# Patient Record
Sex: Female | Born: 2005 | Race: Black or African American | Hispanic: No | Marital: Single | Smoking: Never smoker
Health system: Southern US, Community
[De-identification: ages and names within clinical notes are randomized; demographics above are authoritative.]

## PROBLEM LIST (undated history)

## (undated) ENCOUNTER — Inpatient Hospital Stay (HOSPITAL_COMMUNITY): Payer: Self-pay

## (undated) DIAGNOSIS — F32A Depression, unspecified: Secondary | ICD-10-CM

## (undated) DIAGNOSIS — J45909 Unspecified asthma, uncomplicated: Secondary | ICD-10-CM

## (undated) DIAGNOSIS — H539 Unspecified visual disturbance: Secondary | ICD-10-CM

## (undated) DIAGNOSIS — F419 Anxiety disorder, unspecified: Secondary | ICD-10-CM

## (undated) HISTORY — DX: Depression, unspecified: F32.A

## (undated) HISTORY — PX: NO PAST SURGERIES: SHX2092

---
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
## 2023-03-06 ENCOUNTER — Encounter: Payer: Self-pay | Admitting: Allergy & Immunology

## 2023-03-06 ENCOUNTER — Other Ambulatory Visit: Payer: Self-pay

## 2023-03-06 ENCOUNTER — Ambulatory Visit (INDEPENDENT_AMBULATORY_CARE_PROVIDER_SITE_OTHER): Payer: Medicaid Other | Admitting: Allergy & Immunology

## 2023-03-06 VITALS — BP 96/62 | HR 100 | Temp 98.8°F | Resp 16 | Ht 64.06 in | Wt 134.2 lb

## 2023-03-06 DIAGNOSIS — J4520 Mild intermittent asthma, uncomplicated: Secondary | ICD-10-CM

## 2023-03-06 DIAGNOSIS — L2089 Other atopic dermatitis: Secondary | ICD-10-CM | POA: Diagnosis not present

## 2023-03-06 DIAGNOSIS — L500 Allergic urticaria: Secondary | ICD-10-CM | POA: Diagnosis not present

## 2023-03-06 DIAGNOSIS — J302 Other seasonal allergic rhinitis: Secondary | ICD-10-CM

## 2023-03-06 MED ORDER — CETIRIZINE HCL 10 MG PO TABS: 10.0000 mg | ORAL_TABLET | Freq: Every day | ORAL | 1 refills | Status: DC

## 2023-03-06 MED ORDER — ALBUTEROL SULFATE HFA 108 (90 BASE) MCG/ACT IN AERS: 2.0000 | INHALATION_SPRAY | Freq: Four times a day (QID) | RESPIRATORY_TRACT | 2 refills | Status: AC | PRN

## 2023-03-06 NOTE — Patient Instructions (Addendum)
1. Flexural atopic dermatitis - Skin looks great today. - Call us if you need a prescription for your skin.   2. Mild intermittent asthma, uncomplicated - Lung testing did not look awesome, but it did get better with the treatment. - But you did not FEEL better, so maybe this is more related to technique.  - I do not think you need a controller medication. - Continue with albuterol 2 puffs every 4-6 hours as needed.  - We will see how it looks next time before starting a daily medication, if needed.   3. Allergic urticaria - We are going to get an alpha gal panel to check for a red meat sensitivity. - We will call you in 1-2 weeks with the results of the testing.  - EpiPen use reviewed. - Emergency Action Plan provided.   4. Seasonal allergic rhinitis - We can do environmental testing in the future if needed. - Continue with the cetirizine 10mg  daily.   5. Return in about 6 months (around 09/06/2023). You can have the follow up appointment with Dr. Dellis Anes or a Nurse Practicioner (our Nurse Practitioners are excellent and always have Physician oversight!).    Please inform us of any Emergency Department visits, hospitalizations, or changes in symptoms. Call us before going to the ED for breathing or allergy symptoms since we might be able to fit you in for a sick visit. Feel free to contact us anytime with any questions, problems, or concerns.  It was a pleasure to meet you today!  Websites that have reliable patient information: 1. American Academy of Asthma, Allergy, and Immunology: www.aaaai.org 2. Food Allergy Research and Education (FARE): foodallergy.org 3. Mothers of Asthmatics: http://www.asthmacommunitynetwork.org 4. American College of Allergy, Asthma, and Immunology: www.acaai.org   COVID-19 Vaccine Information can be found at: [email or web address].[email or web address] For questions related to vaccine distribution or appointments,  please email [email or web address] or call [phone].   We realize that you might be concerned about having an allergic reaction to the COVID19 vaccines. To help with that concern, WE ARE OFFERING THE COVID19 VACCINES IN OUR OFFICE! Ask the front desk for dates!     "Like" [place] on Facebook and Instagram for our latest updates!      A healthy democracy works best when [employer] participate! Make sure you are registered to vote! If you have moved or changed any of your contact information, you will need to get this updated before voting!  In some cases, you MAY be able to register to vote online: [email or web address]

## 2023-03-06 NOTE — Progress Notes (Signed)
NEW PATIENT  Date of Service/Encounter:  03/06/23  Consult requested by: Practice, [facility]   Assessment:   Mild intermittent asthma, uncomplicated  Flexural atopic dermatitis  Allergic urticaria - getting alpha gal panel and tryptase  Seasonal allergic rhinitis - did not do testing since symptoms were not severe  Plan/Recommendations:   1. Flexural atopic dermatitis - Skin looks great today. - Call us if you need a prescription for your skin.   2. Mild intermittent asthma, uncomplicated - Lung testing did not look awesome, but it did get better with the treatment. - But you did not FEEL better, so maybe this is more related to technique.  - I do not think you need a controller medication. - Continue with albuterol 2 puffs every 4-6 hours as needed.  - We will see how it looks next time before starting a daily medication, if needed.   3. Allergic urticaria - We are going to get an alpha gal panel to check for a red meat sensitivity. - We will call you in 1-2 weeks with the results of the testing.  - EpiPen use reviewed. - Emergency Action Plan provided.   4. Seasonal allergic rhinitis - We can do environmental testing in the future if needed. - Continue with the cetirizine 10mg  daily.   5. Return in about 6 months (around 09/06/2023). You can have the follow up appointment with Dr. Dellis Ibarra or a Nurse Practicioner (our Nurse Practitioners are excellent and always have Physician oversight!).    This note in its entirety was forwarded to the Provider who requested this consultation.  Subjective:   Kelly Ibarra is a 17 y.o. female presenting today for evaluation of  Chief Complaint  Patient presents with   Allergic Reaction    Tick bite occurred May/June 2024 on leg and ankle. After bit she went on lunch and ate a chicken strip tray and began breaking out in hives/rash on the face and arms. After she began having trouble breathing. Initially she thought it  was reaction to the food. PCP says it could be from tick bite and sent her to our office.    Asthma    Says she had a flare up when she was bit.    Eczema    Says she has flare ups at times nit as bad as she was when she was a child. Says she notices huge flares with she changes soaps.     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  Kelly Ibarra was referred by Practice, [facility].     Kelly Ibarra is a 17 y.o. female presenting for an evaluation of an allergic reaction .  She got a tick bite two months ago. She started having some swelling around the site of the bites. She thought that this was related to something she ate at [employer] (chicken strips). For almost a week, it was hard to breathe and she had some throat scratching. She did have normal poops during the course of the week. She did have hives over the course of the week as well. She thinks that she might have some red meat that day as well. She has had chikcne since that time without a problem. She  has been itchy with red meat since that time.  She did go to see her PCP.  She did get an EpiPen. She got doxycycline because of the tick bite.    Asthma/Respiratory Symptom History: She does have albuterol to use as needed. She gets a new one rarely.  She does have intermittent coughing at night. She thinks that her asthma is under good control.   Allergic Rhinitis Symptom History: She has watery eyes when the pollen is bad. She uses cetirizine for this.  She has never been tested at all for environmental allergens.   Skin Symptom History: She has a long standing history of eczema. She has bene using the same brand cream since she was very young. This is  the only thing that does not break out her skin. She tends to stay to certain types of soaps.   Otherwise, there is no history of other atopic diseases, including drug allergies, stinging insect allergies, or contact dermatitis. There is no significant infectious history. Vaccinations are up to date.    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 If you have any questions, ask your nurse or doctor.          STOP taking these medications    buPROPion 300 MG 24 hr tablet Commonly known as: Wellbutrin XL Stopped by: Alfonse Spruce   hydrOXYzine 25 MG tablet Commonly known as: ATARAX Stopped by: Alfonse Spruce   loratadine 10 MG tablet Commonly known as: CLARITIN Stopped by: Alfonse Spruce   ondansetron 4 MG tablet Commonly known as: ZOFRAN Stopped by: Alfonse Spruce   OXcarbazepine 150 MG tablet Commonly known as: TRILEPTAL Stopped by: Alfonse Spruce   traZODone 50 MG tablet Commonly known as: DESYREL Stopped by: Alfonse Spruce       TAKE these medications    albuterol 108 (90 Base) MCG/ACT inhaler Commonly known as: VENTOLIN HFA Inhale 2 puffs into the lungs every 6 (six) hours as needed for wheezing or shortness of breath. What changed:  how much to take when to take this Changed by: Alfonse Spruce   CeraVe Moisturizing Crea Apply 1 Application topically daily.   CERAVE SA BODY WASH EX Apply 1 Application topically daily.   cetirizine 10 MG tablet Commonly known as: ZYRTEC Take 1 tablet (10 mg total) by mouth daily. Started by: Alfonse Spruce   EPINEPHrine 0.3  mg/0.3 mL Soaj injection Commonly known as: EPI-PEN Inject 0.3 mg into the muscle as needed for anaphylaxis.        Birth History: non-contributory  Developmental History: non-contributory  Past Surgical History: No past surgical history on file.   Family History: Family History  Problem Relation Age of Onset   Asthma Mother    Allergic rhinitis Mother    Diabetes Father      Social History: Sigal lives at home with her family. She lives in a house with some mildew in the home. There are dogs and cats outside of the home. There is linoleum in the main living areas and carpeting in the bedrooms. There are no dust mite  coverings on the bedding. There is vape exposure. She has been vaping for a few years. She currently works at [employer] in [place]. 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    Review of systems otherwise negative other than that mentioned in the HPI.    Objective:   Blood pressure 96/62, pulse 100, temperature 98.8 F (37.1 C), temperature source Temporal, resp. rate 16, height 5' 4.06" (1.627 m), weight 134 lb 3.2 oz (60.9 kg), SpO2 97%. Body mass index is 23 kg/m.     Physical Exam Vitals reviewed.  Constitutional:      Appearance: She is well-developed.  HENT:     Head: Normocephalic and atraumatic.     Right Ear: Tympanic membrane, ear canal and external ear normal. No drainage, swelling or tenderness. Tympanic membrane is not injected, scarred, erythematous, retracted or bulging.     Left Ear: Tympanic membrane, ear canal and external ear normal. No drainage, swelling or tenderness. Tympanic membrane is not injected, scarred, erythematous, retracted or bulging.     Nose: No nasal deformity, septal deviation, mucosal edema or rhinorrhea.     Right Turbinates: Enlarged and swollen.     Left Turbinates: Enlarged and swollen.     Right  Sinus: No maxillary sinus tenderness or frontal sinus tenderness.     Left Sinus: No maxillary sinus tenderness or frontal sinus tenderness.     Mouth/Throat:     Mouth: Mucous membranes are not pale and not dry.     Pharynx: Uvula midline.  Eyes:     General:        Right eye: No discharge.        Left eye: No discharge.     Conjunctiva/sclera: Conjunctivae normal.     Right eye: Right conjunctiva is not injected. No chemosis.    Left eye: Left conjunctiva is not injected. No chemosis.    Pupils: Pupils are equal, round, and reactive to light.  Cardiovascular:     Rate and Rhythm: Normal rate and regular rhythm.     Heart sounds: Normal heart sounds.  Pulmonary:     Effort: Pulmonary effort is normal. No tachypnea, accessory muscle usage or respiratory distress.     Breath sounds: Normal breath sounds. No wheezing, rhonchi or rales.  Chest:     Chest wall: No tenderness.  Abdominal:     Tenderness: There is no abdominal tenderness. There is no guarding or rebound.  Lymphadenopathy:     Head:     Right side of head: No submandibular, tonsillar or occipital adenopathy.     Left side of head: No submandibular, tonsillar or occipital adenopathy.     Cervical: No cervical adenopathy.  Skin:    Coloration: Skin is not pale.     Findings: No abrasion, erythema, petechiae or rash. Rash is not papular, urticarial or vesicular.  Neurological:     Mental Status: She is alert.  Psychiatric:        Behavior: Behavior is cooperative.      Diagnostic studies:    Spirometry: results abnormal (FEV1: 1.91/67%, FVC: 2.94/91%, FEV1/FVC: 65%).    Spirometry consistent with moderate obstructive disease. Xopenex four puffs via MDI treatment given in clinic with significant improvement in FEV1 per ATS criteria. But she did not subjectively feel better.   Allergy Studies: labs sent instead  Malachi Bonds, MD [facility]

## 2023-06-06 NOTE — Progress Notes (Unsigned)
[address] [place] [address] Dept: [phone]  FOLLOW UP NOTE  Patient ID: Kelly Ibarra, female    DOB: 2006/07/31  Age: 17 y.o. MRN: [id number] Date of Office Visit: 06/07/2023  Assessment  Chief Complaint: Follow-up (Always Nauseas )  HPI Kelly Ibarra is a 17 year old female who presents to the clinic for follow-up visit.  She was last seen in this clinic on 03/06/2023 as a new patient by Dr. Dellis Anes for evaluation of asthma, chronic rhinitis, urticaria, atopic dermatitis, and possible alpha gal.  Her last alpha gal testing on 03/06/2023 was positive to alpha gal and components including beef, lamb, and pork.  Asthma is reported as moderately well-controlled with cough producing mucus occurring about once a week.  Otherwise, she denies shortness of breath or wheeze with activity or rest.  She continues albuterol about once a week with moderate relief of symptoms.    Allergic rhinitis is reported as moderately well-controlled with rhinorrhea as the main symptom which occurs mainly during cold weather.  She denies nasal congestion, sneezing, and postnasal drainage.  She continues cetirizine as needed and is not currently using Flonase or nasal saline rinses.  There is no recent environmental allergy testing.    Atopic dermatitis is reported as moderately well-controlled with occasional red and itchy areas mainly occurring on her face and jawline.  She continues a daily moisturizing routine with moderate relief of symptoms.   She continues to avoid mammalian meats and dairy products with no accidental ingestion or Epipen use since her last visit to this clinic. She does report a case of hives that occurred about 1 week ago for which she took Benadryl and went to bed. When she woke up in the morning, the hives had resolved. She reports that she did have a scratchy throat the night before the hives appeared. She denies cardiopulmonary symptoms with the hives. She reports  that she has had frequent vomiting. She reports that she has recently had some diagnostic testing for this vomiting that has been negative as of yet.   Her current medications are listed in the chart  Drug Allergies:  No Known Allergies  Physical Exam: BP 94/68   Pulse (!) 103   Temp 98.1 F (36.7 C)   Resp 18   Wt 128 lb (58.1 kg)   SpO2 98%   BMI 21.93 kg/m    Physical Exam Vitals reviewed.  HENT:     Head: Normocephalic and atraumatic.     Right Ear: Tympanic membrane normal.     Left Ear: Tympanic membrane normal.     Nose:     Comments: Bilateral nares normal. Pharynx normal. Ears normal. Eyes normal.    Mouth/Throat:     Pharynx: Oropharynx is clear.  Eyes:     Conjunctiva/sclera: Conjunctivae normal.  Cardiovascular:     Rate and Rhythm: Normal rate and regular rhythm.     Heart sounds: Normal heart sounds. No murmur heard. Pulmonary:     Effort: Pulmonary effort is normal.     Breath sounds: Normal breath sounds.     Comments: Lungs clear to auscultation Musculoskeletal:        General: Normal range of motion.     Cervical back: Normal range of motion and neck supple.  Skin:    General: Skin is warm and dry.  Neurological:     Mental Status: She is oriented to person, place, and time.  Psychiatric:  Mood and Affect: Mood normal.        Behavior: Behavior normal.        Thought Content: Thought content normal.        Judgment: Judgment normal.     Diagnostics: FVC 3.04 which is 93% predicted value, FEV1 2.01 which is 70% of predicted value.  Spirometry indicates moderate airway obstruction.  Postbronchodilator spirometry with 7.6% improvement in FEV1  Assessment and Plan: 1. Idiopathic urticaria   2. Mild intermittent asthma, uncomplicated   3. Asthma, not well controlled, unspecified asthma severity, unspecified whether complicated, unspecified whether persistent   4. Not well controlled moderate persistent asthma     Meds ordered this  encounter  Medications   fluticasone (FLOVENT HFA) 110 MCG/ACT inhaler    Sig: Inhale 2 puffs into the lungs in the morning and at bedtime.    Dispense:  12 g    Refill:  0   Spacer/Aero-Holding Chambers DEVI    Sig: Use spacer as directed with inhaler.    Dispense:  1 each    Refill:  0    Patient Instructions  Asthma Begin Flovent 110-2 puffs twice a day with a spacer to prevent cough or wheeze Continue albuterol 2 puffs once every 4 hours as needed for cough or wheeze You may use albuterol 2 puffs 5 to 15 minutes before activity to decrease cough or wheeze  Chronic rhinitis Continue cetirizine 10 mg once a day as needed for runny nose or itch Consider saline nasal rinses as needed for nasal symptoms. Use this before any medicated nasal sprays for best result Let's update your allergy testing. A lab order has been placed to help [place] evaluate your allergies. We will call you when the results become available  Urticaria Continue cetirizine 10 mg once a day as needed for hives or itch.  You may take an additional dose of cetirizine 10 mg once a day if needed for breakthrough symptoms If your symptoms re-occur, begin a journal of events that occurred for up to 6 hours before your symptoms began including foods and beverages consumed, soaps or perfumes you had contact with, and medications.   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  In case of an allergic reaction, take Benadryl 50 mg every 4 hours, and if life-threatening symptoms occur, inject with EpiPen 0.3 mg. We can retest your alpha gal levels in 6 months to a year.  Food allergy Let's update your allergy testing. A lab order has been placed to help [place] evaluate your allergies. We will call you when the results become available  Call the clinic if this treatment plan is not working well for you.  Follow  up in 1 month or sooner if needed.  No follow-ups on file.    Thank you for the opportunity to care for this patient.  Please do not hesitate to contact me with questions.  Thermon Leyland, FNP [facility]

## 2023-06-06 NOTE — Patient Instructions (Signed)
Asthma Begin Flovent 110-2 puffs twice a day with a spacer to prevent cough or wheeze Continue albuterol 2 puffs once every 4 hours as needed for cough or wheeze You may use albuterol 2 puffs 5 to 15 minutes before activity to decrease cough or wheeze  Chronic rhinitis Continue cetirizine 10 mg once a day as needed for runny nose or itch Consider saline nasal rinses as needed for nasal symptoms. Use this before any medicated nasal sprays for best result Let's update your allergy testing. A lab order has been placed to help [place] evaluate your allergies. We will call you when the results become available  Urticaria Continue cetirizine 10 mg once a day as needed for hives or itch.  You may take an additional dose of cetirizine 10 mg once a day if needed for breakthrough symptoms If your symptoms re-occur, begin a journal of events that occurred for up to 6 hours before your symptoms began including foods and beverages consumed, soaps or perfumes you had contact with, and medications.   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  In case of an allergic reaction, take Benadryl 50 mg every 4 hours, and if life-threatening symptoms occur, inject with EpiPen 0.3 mg. We can retest your alpha gal levels in 6 months to a year.  Food allergy Let's update your allergy testing. A lab order has been placed to help [place] evaluate your allergies. We will call you when the results become available  Call the clinic if this treatment plan is not working well for you.  Follow up in 1 month or sooner if needed.

## 2023-06-07 ENCOUNTER — Encounter: Payer: Self-pay | Admitting: Family Medicine

## 2023-06-07 ENCOUNTER — Ambulatory Visit (INDEPENDENT_AMBULATORY_CARE_PROVIDER_SITE_OTHER): Payer: Medicaid Other | Admitting: Family Medicine

## 2023-06-07 VITALS — BP 94/68 | HR 103 | Temp 98.1°F | Resp 18 | Wt 128.0 lb

## 2023-06-07 DIAGNOSIS — J4540 Moderate persistent asthma, uncomplicated: Secondary | ICD-10-CM

## 2023-06-07 DIAGNOSIS — J45909 Unspecified asthma, uncomplicated: Secondary | ICD-10-CM

## 2023-06-07 DIAGNOSIS — L501 Idiopathic urticaria: Secondary | ICD-10-CM

## 2023-06-07 DIAGNOSIS — J4520 Mild intermittent asthma, uncomplicated: Secondary | ICD-10-CM

## 2023-06-07 MED ORDER — SPACER/AERO-HOLDING CHAMBERS DEVI: 0 refills | Status: AC

## 2023-06-07 MED ORDER — FLUTICASONE PROPIONATE HFA 110 MCG/ACT IN AERO: 2.0000 | INHALATION_SPRAY | Freq: Two times a day (BID) | RESPIRATORY_TRACT | 0 refills | Status: DC

## 2023-06-09 ENCOUNTER — Encounter: Payer: Self-pay | Admitting: Family Medicine

## 2023-06-10 ENCOUNTER — Telehealth: Payer: Self-pay | Admitting: Family Medicine

## 2023-06-10 NOTE — Telephone Encounter (Signed)
LMOM for the patient to call the clinic for further information.

## 2023-07-12 ENCOUNTER — Other Ambulatory Visit: Payer: Self-pay

## 2023-07-12 ENCOUNTER — Encounter: Payer: Self-pay | Admitting: Family Medicine

## 2023-07-12 ENCOUNTER — Ambulatory Visit (INDEPENDENT_AMBULATORY_CARE_PROVIDER_SITE_OTHER): Payer: Medicaid Other | Admitting: Family Medicine

## 2023-07-12 VITALS — BP 112/72 | HR 86 | Temp 97.7°F | Ht 65.75 in | Wt 135.6 lb

## 2023-07-12 DIAGNOSIS — L2084 Intrinsic (allergic) eczema: Secondary | ICD-10-CM | POA: Insufficient documentation

## 2023-07-12 DIAGNOSIS — J3089 Other allergic rhinitis: Secondary | ICD-10-CM | POA: Diagnosis not present

## 2023-07-12 DIAGNOSIS — Z91018 Allergy to other foods: Secondary | ICD-10-CM | POA: Diagnosis not present

## 2023-07-12 DIAGNOSIS — J4540 Moderate persistent asthma, uncomplicated: Secondary | ICD-10-CM | POA: Diagnosis not present

## 2023-07-12 DIAGNOSIS — J45909 Unspecified asthma, uncomplicated: Secondary | ICD-10-CM | POA: Insufficient documentation

## 2023-07-12 DIAGNOSIS — T7800XA Anaphylactic reaction due to unspecified food, initial encounter: Secondary | ICD-10-CM

## 2023-07-12 DIAGNOSIS — L2089 Other atopic dermatitis: Secondary | ICD-10-CM | POA: Insufficient documentation

## 2023-07-12 DIAGNOSIS — T7800XD Anaphylactic reaction due to unspecified food, subsequent encounter: Secondary | ICD-10-CM

## 2023-07-12 LAB — ALLERGENS, ZONE 2
Alternaria Alternata IgE: <0.1 kU/L
Amer Sycamore IgE Qn: <0.1 kU/L
Aspergillus Fumigatus IgE: <0.1 kU/L
Bahia Grass IgE: <0.1 kU/L
Bermuda Grass IgE: <0.1 kU/L
Cat Dander IgE: 0.1 kU/L — AB
Cedar, Mountain IgE: <0.1 kU/L
Cladosporium Herbarum IgE: <0.1 kU/L
Cockroach, American IgE: <0.1 kU/L
Common Silver Birch IgE: <0.1 kU/L
D Farinae IgE: 1.22 kU/L — AB
D Pteronyssinus IgE: 1.97 kU/L — AB
Dog Dander IgE: <0.1 kU/L
Elm, American IgE: <0.1 kU/L
Hickory, White IgE: <0.1 kU/L
Johnson Grass IgE: <0.1 kU/L
Maple/Box Elder IgE: <0.1 kU/L
Mucor Racemosus IgE: <0.1 kU/L
Mugwort IgE Qn: <0.1 kU/L
Nettle IgE: <0.1 kU/L
Oak, White IgE: <0.1 kU/L
Penicillium Chrysogen IgE: <0.1 kU/L
Pigweed, Rough IgE: <0.1 kU/L
Plantain, English IgE: <0.1 kU/L
Ragweed, Short IgE: <0.1 kU/L
Sheep Sorrel IgE Qn: <0.1 kU/L
Stemphylium Herbarum IgE: <0.1 kU/L
Sweet gum IgE RAST Ql: <0.1 kU/L
Timothy Grass IgE: <0.1 kU/L
White Mulberry IgE: <0.1 kU/L

## 2023-07-12 LAB — FOOD ALLERGY PROFILE
Allergen Corn, IgE: <0.1 kU/L
Clam IgE: <0.1 kU/L
Codfish IgE: <0.1 kU/L
Egg White IgE: <0.1 kU/L
Milk IgE: 0.33 kU/L — AB
Peanut IgE: <0.1 kU/L
Scallop IgE: <0.1 kU/L
Sesame Seed IgE: <0.1 kU/L
Shrimp IgE: <0.1 kU/L
Soybean IgE: <0.1 kU/L
Walnut IgE: <0.1 kU/L
Wheat IgE: <0.1 kU/L

## 2023-07-12 NOTE — Progress Notes (Signed)
[address] [place] [address] Dept: [phone]  FOLLOW UP NOTE  Patient ID: Kelly Ibarra, female    DOB: [birth year]  Age: 17 y.o. MRN: [id number] Date of Office Visit: 07/12/2023  Assessment  Chief Complaint: Follow-up  HPI Kelly Ibarra is a 17 year old female who presents to the clinic for follow-up visit.  She was last seen in this clinic on 06/07/2023 by Thermon Leyland, FNP, for evaluation of asthma, allergic rhinitis, urticaria, atopic dermatitis, alpha gal allergy, and possible food allergies.    At today's visit, she reports her asthma has been moderately well controlled with some shortness of breath occurring intermittently. She reports that she has been using her Ventolin inhaler twice a day with a spacer and has not started Flovent 110 at this time. We had a detailed discussion regarding the mechanism of action with albuterol and Flovent.   Allergic rhinitis is reported as moderately well controlled with intermittent nasal symptoms for which she is using cetirizine as needed and is not using Flonase or nasal saline rinses. Her last environmental allergy testing via lab was on 07/10/2023 and was positive to dust mites, cat, and dog.   She continues to avoid mammalian meats and most products containing milk. She reports that she occasionally eats butter which causes diarrhea. Her last alpha gal lab testing was positive on 03/06/2023. Her last food allergy testing via lab was on 07/10/2023 and was positive to milk.   Her current medications are listed in the chart.   Drug Allergies:  No Known Allergies  Physical Exam: BP 112/72   Pulse 86   Temp 97.7 F (36.5 C)   Ht 5' 5.75" (1.67 m)   Wt 135 lb 9.6 oz (61.5 kg)   SpO2 98%   BMI 22.05 kg/m    Physical Exam Vitals reviewed.  Constitutional:      Appearance: Normal appearance.  HENT:     Head: Normocephalic and atraumatic.     Right Ear: Tympanic membrane normal.     Left Ear: Tympanic membrane normal.      Nose:     Comments: Bilateral nares slightly erythematous with thin clear nasal drainage noted. Pharynx normal. Ears normal. Eyes normal.    Mouth/Throat:     Pharynx: Oropharynx is clear.  Eyes:     Conjunctiva/sclera: Conjunctivae normal.  Cardiovascular:     Rate and Rhythm: Normal rate and regular rhythm.     Heart sounds: Normal heart sounds. No murmur heard. Pulmonary:     Effort: Pulmonary effort is normal.     Breath sounds: Normal breath sounds.     Comments: Lungs clear to auscultation Musculoskeletal:        General: Normal range of motion.     Cervical back: Normal range of motion and neck supple.  Skin:    General: Skin is warm and dry.  Neurological:     Mental Status: She is alert and oriented to person, place, and time.  Psychiatric:        Mood and Affect: Mood normal.        Behavior: Behavior normal.        Thought Content: Thought content normal.        Judgment: Judgment normal.     Diagnostics: FVC 2.89 which is 91% of predicted value. FEV1 1.90 which is 66% of predicted value. Spirometry indicates airway obstruction. Post bronchodilator therapy with 35% improvement in FEV1.  Assessment and Plan: 1. Not well controlled moderate persistent  asthma   2. Perennial allergic rhinitis   3. Flexural atopic dermatitis   4. 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   Patient Instructions  Asthma Begin Flovent 110-2 puffs twice a day with a spacer to prevent cough or wheeze Continue albuterol 2 puffs once every 4 hours as needed for cough or wheeze You may use albuterol 2 puffs 5 to 15 minutes before activity to decrease cough or wheeze  Allergic rhinitis Continue allergen avoidance measures directed toward dust mite, cat and dog as listed below Continue cetirizine 10 mg once a day as needed for runny nose or itch Consider saline nasal rinses as needed for nasal  symptoms. Use this before any medicated nasal sprays for best result  Urticaria Continue cetirizine 10 mg once a day as needed for hives or itch.  You may take an additional dose of cetirizine 10 mg once a day if needed for breakthrough symptoms If your symptoms re-occur, begin a journal of events that occurred for up to 6 hours before your symptoms began including foods and beverages consumed, soaps or perfumes you had contact with, and medications.   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  In case of an allergic reaction, take Benadryl 50 mg every 4 hours, and if life-threatening symptoms occur, inject with EpiPen 0.3 mg. We can retest your alpha gal levels in July 2025  Food allergy Continue to avoid cow's milk products.  In case of an allergic reaction, take Benadryl 50 mg every 4 hours, and if life-threatening symptoms occur, inject with EpiPen 0.3 mg.  Call the clinic if this treatment plan is not working well for you.  Follow up in 3 months or sooner if needed.  Return in about 3 months (around 10/10/2023), or if symptoms worsen or fail to improve.    Thank you for the opportunity to care for this patient.  Please do not hesitate to contact me with questions.  Thermon Leyland, FNP [facility]

## 2023-07-12 NOTE — Addendum Note (Signed)
Addended by: Philipp Deputy on: 07/12/2023 03:10 PM   Modules accepted: Orders

## 2023-07-12 NOTE — Addendum Note (Signed)
Addended by: Philipp Deputy on: 07/12/2023 03:11 PM   Modules accepted: Orders

## 2023-07-12 NOTE — Patient Instructions (Addendum)
Asthma Begin Flovent 110-2 puffs twice a day with a spacer to prevent cough or wheeze Continue albuterol 2 puffs once every 4 hours as needed for cough or wheeze You may use albuterol 2 puffs 5 to 15 minutes before activity to decrease cough or wheeze  Allergic rhinitis Continue allergen avoidance measures directed toward dust mite, cat and dog as listed below Continue cetirizine 10 mg once a day as needed for runny nose or itch Consider saline nasal rinses as needed for nasal symptoms. Use this before any medicated nasal sprays for best result  Urticaria Continue cetirizine 10 mg once a day as needed for hives or itch.  You may take an additional dose of cetirizine 10 mg once a day if needed for breakthrough symptoms If your symptoms re-occur, begin a journal of events that occurred for up to 6 hours before your symptoms began including foods and beverages consumed, soaps or perfumes you had contact with, and medications.   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  In case of an allergic reaction, take Benadryl 50 mg every 4 hours, and if life-threatening symptoms occur, inject with EpiPen 0.3 mg. We can retest your alpha gal levels in July 2025  Food allergy Continue to avoid cow's milk products.  In case of an allergic reaction, take Benadryl 50 mg every 4 hours, and if life-threatening symptoms occur, inject with EpiPen 0.3 mg.  Call the clinic if this treatment plan is not working well for you.  Follow up in 3 months or sooner if needed.

## 2023-07-12 NOTE — Progress Notes (Signed)
Can you please let this patient know that her environmental allergy testing was positive for dust mites, cat, and dog. Also the food testing was negative except for milk which was positive. Please have her avoid cow's milk and products containing cow's milk (butter). Please advise her to carry a set of epipens at all times. She should continue to avoid mammalian meats also. Thank you

## 2023-09-12 IMAGING — DX DG FINGER THUMB 2+V*L*
3 series · 3 of 3 positions shown · non-contrast
Comparison: None.

CLINICAL DATA: Close thumb in car door this morning. Bruising to
the IP joint.

EXAM:
LEFT THUMB 2+V

[finger ap]
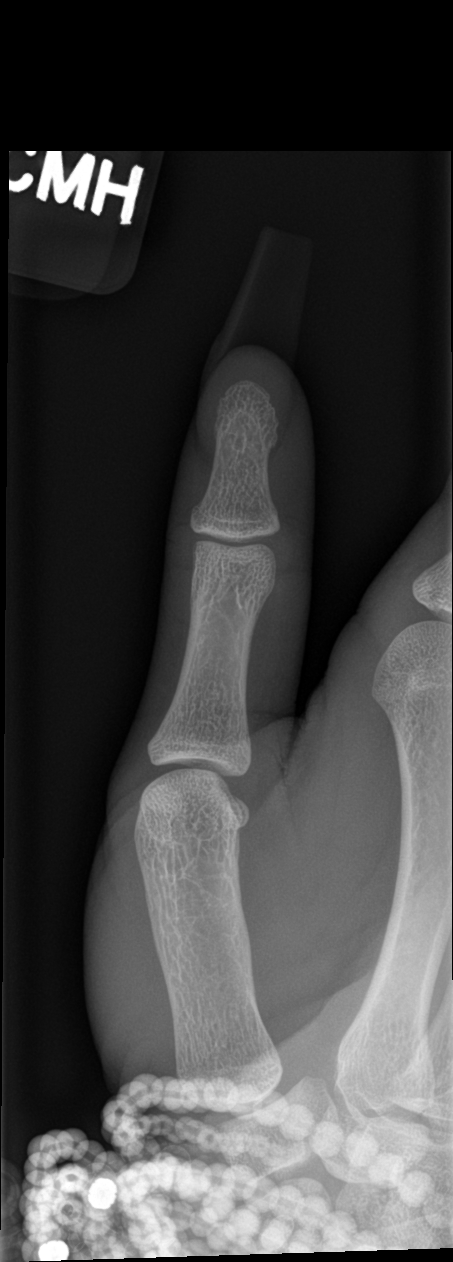

[finger obl]
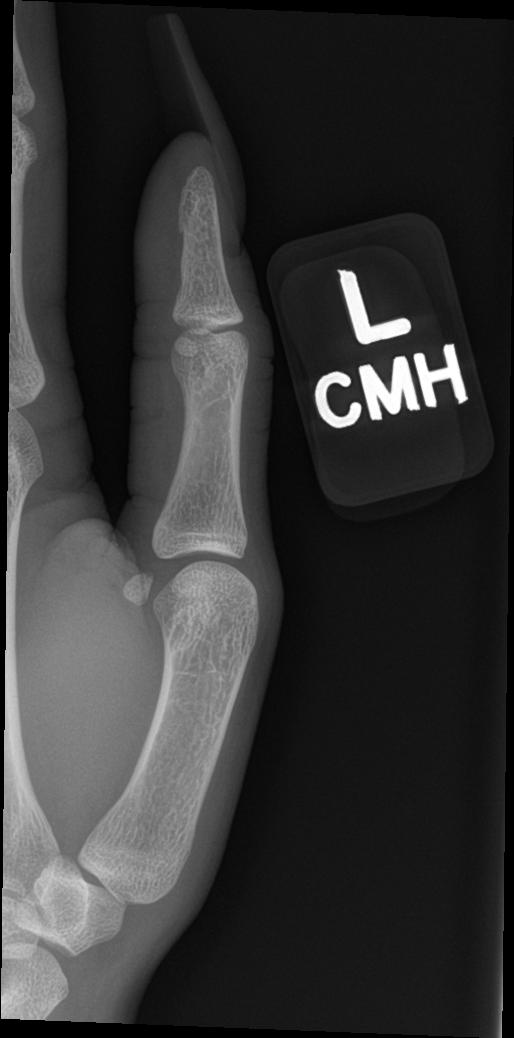

[finger lat]
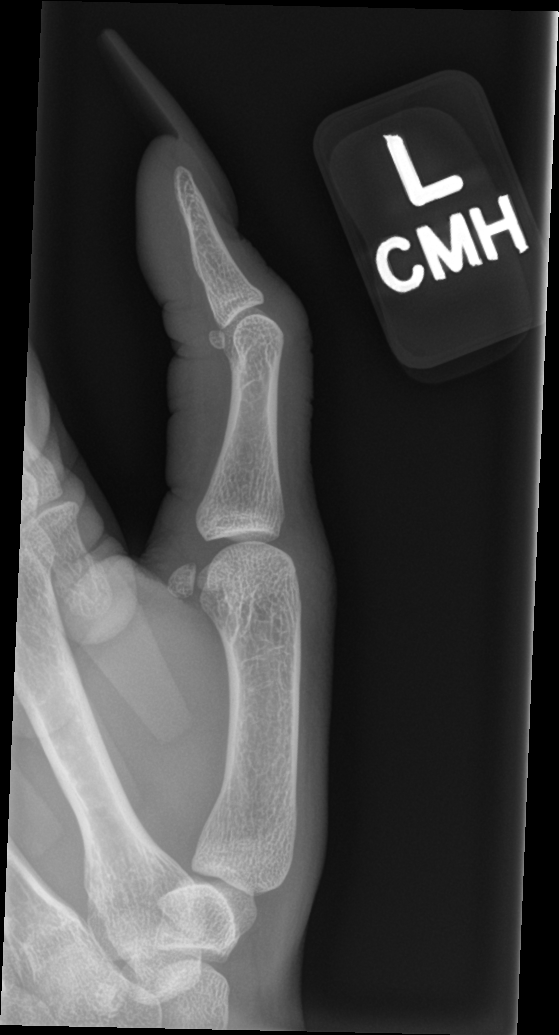

[3 of 3 positions shown; findings below may reference images not displayed]

FINDINGS: There is no evidence of fracture or dislocation. There is no
evidence of arthropathy or other focal bone abnormality. Soft
tissues are unremarkable.
IMPRESSION: Negative.

## 2023-10-08 ENCOUNTER — Inpatient Hospital Stay (HOSPITAL_COMMUNITY)

## 2023-10-08 ENCOUNTER — Encounter (HOSPITAL_COMMUNITY): Payer: Self-pay | Admitting: *Deleted

## 2023-10-08 ENCOUNTER — Inpatient Hospital Stay (HOSPITAL_COMMUNITY)
Admission: AD | Admit: 2023-10-08 | Discharge: 2023-10-08 | Disposition: A | Discharge status: Home / Self Care | Attending: Obstetrics and Gynecology | Admitting: Obstetrics and Gynecology

## 2023-10-08 DIAGNOSIS — Z3A01 Less than 8 weeks gestation of pregnancy: Secondary | ICD-10-CM | POA: Diagnosis not present

## 2023-10-08 DIAGNOSIS — Z3491 Encounter for supervision of normal pregnancy, unspecified, first trimester: Secondary | ICD-10-CM

## 2023-10-08 DIAGNOSIS — F17290 Nicotine dependence, other tobacco product, uncomplicated: Secondary | ICD-10-CM | POA: Insufficient documentation

## 2023-10-08 DIAGNOSIS — O99331 Smoking (tobacco) complicating pregnancy, first trimester: Secondary | ICD-10-CM | POA: Diagnosis not present

## 2023-10-08 DIAGNOSIS — O219 Vomiting of pregnancy, unspecified: Secondary | ICD-10-CM

## 2023-10-08 DIAGNOSIS — R1030 Lower abdominal pain, unspecified: Secondary | ICD-10-CM | POA: Diagnosis present

## 2023-10-08 LAB — CBC
HCT: 35.2 % — ABNORMAL LOW (ref 36.0–46.0)
Hemoglobin: 12.2 g/dL (ref 12.0–15.0)
MCH: 27.7 pg (ref 26.0–34.0)
MCHC: 34.7 g/dL (ref 30.0–36.0)
MCV: 80 fL (ref 80.0–100.0)
Platelets: 249 10*3/uL (ref 150–400)
RBC: 4.4 MIL/uL (ref 3.87–5.11)
RDW: 14.3 % (ref 11.5–15.5)
WBC: 4.6 10*3/uL (ref 4.0–10.5)
nRBC: 0 % (ref 0.0–0.2)

## 2023-10-08 LAB — URINALYSIS, ROUTINE W REFLEX MICROSCOPIC
Bilirubin Urine: NEGATIVE
Glucose, UA: NEGATIVE mg/dL
Hgb urine dipstick: NEGATIVE
Ketones, ur: 20 mg/dL — AB
Leukocytes,Ua: NEGATIVE
Nitrite: NEGATIVE
Protein, ur: NEGATIVE mg/dL
Specific Gravity, Urine: 1.018 (ref 1.005–1.030)
pH: 6 (ref 5.0–8.0)

## 2023-10-08 LAB — BASIC METABOLIC PANEL
Anion gap: 11 (ref 5–15)
BUN: 7 mg/dL (ref 6–20)
CO2: 22 mmol/L (ref 22–32)
Calcium: 9.3 mg/dL (ref 8.9–10.3)
Chloride: 102 mmol/L (ref 98–111)
Creatinine, Ser: 0.73 mg/dL (ref 0.44–1.00)
GFR, Estimated: >60 mL/min (ref >60)
Glucose, Bld: 85 mg/dL (ref 70–99)
Potassium: 3.3 mmol/L — ABNORMAL LOW (ref 3.5–5.1)
Sodium: 135 mmol/L (ref 135–145)

## 2023-10-08 LAB — HCG, QUANTITATIVE, PREGNANCY: hCG, Beta Chain, Quant, S: 102291 m[IU]/mL — ABNORMAL HIGH (ref <5)

## 2023-10-08 LAB — POCT PREGNANCY, URINE: Preg Test, Ur: POSITIVE — AB

## 2023-10-08 MED ORDER — PRENATAL COMPLETE 14-0.4 MG PO TABS
1.0000 | ORAL_TABLET | Freq: Every day | ORAL | 3 refills | Status: AC
Start: 2023-10-08 — End: ?

## 2023-10-08 MED ORDER — ONDANSETRON 4 MG PO TBDP: 4.0000 mg | ORAL_TABLET | Freq: Three times a day (TID) | ORAL | 0 refills | Status: DC | PRN

## 2023-10-08 MED ORDER — ONDANSETRON 4 MG PO TBDP
8.0000 mg | ORAL_TABLET | Freq: Once | ORAL | Status: AC
  Administered 2023-10-08: 8 mg via ORAL
  Filled 2023-10-08: qty 2

## 2023-10-08 NOTE — Discharge Instructions (Signed)
 [facility] [employer] for [employer] at [employer] [facility]    Phone: [phone]  Center for [employer] at [place]   Phone: [phone]  [facility] for [employer] at [place]  Phone: [phone]  [facility] for [employer] at [employer]  Phone: [phone]  [facility] for [employer] at [place]  Phone: [phone]  [facility] at [facility]   Phone: [phone]  [facility]       Phone: [phone]  [facility]    Phone: [phone]   [facility]    Phone: [phone]  [facility]    Phone: [phone]  [facility]    Phone: [phone]  [facility]       Phone: [phone]   [facility]  Phone: [phone]  [facility]    Phone: [phone]  [facility]   Phone: [phone]  [facility]      Phone: [phone]  [facility]    Phone: [phone]    Safe Medications in Pregnancy   Acne:  Benzoyl Peroxide  Salicylic Acid   Backache/Headache:  Tylenol: 2 regular strength every 4 hours OR               2 Extra strength every 6 hours   Colds/Coughs/Allergies:  Benadryl (alcohol free) 25 mg every 6 hours as needed  Breath right strips  Claritin  Cepacol throat lozenges  Chloraseptic throat spray  Cold-Eeze- up to three times per day  Cough drops, alcohol free  Flonase (by prescription only)  Guaifenesin  Mucinex  Robitussin DM (plain only, alcohol free)  Saline nasal spray/drops  Sudafed (pseudoephedrine) & Actifed * use only after [redacted] weeks gestation and if you do not have high blood pressure  Tylenol  Vicks Vaporub  Zinc lozenges  Zyrtec   Constipation:  Colace  Ducolax suppositories  Fleet enema  Glycerin suppositories   Metamucil  Milk of magnesia  Miralax  Senokot  Smooth move tea   Diarrhea:  Kaopectate  Imodium A-D   *NO pepto Bismol   Hemorrhoids:  Anusol  Anusol HC  Preparation H  Tucks   Indigestion:  Tums  Maalox  Mylanta  Zantac  Pepcid   Insomnia:  Benadryl (alcohol free) 25mg  every 6 hours as needed  Tylenol PM  Unisom, no Gelcaps   Leg Cramps:  Tums  MagGel   Nausea/Vomiting:  Bonine  Dramamine  Emetrol  Ginger extract  Sea bands  Meclizine  Nausea medication to take during pregnancy:  Unisom (doxylamine succinate 25 mg tablets) Take one tablet daily at bedtime. If symptoms are not adequately controlled, the dose can be increased to a maximum recommended dose of two tablets daily (1/2 tablet in the morning, 1/2 tablet mid-afternoon and one at bedtime).  Vitamin B6 100mg  tablets. Take one tablet twice a day (up to 200 mg per day).   Skin Rashes:  Aveeno products  Benadryl cream or 25mg  every 6 hours as needed  Calamine Lotion  1% cortisone cream   Yeast infection:  Gyne-lotrimin 7  Monistat 7    **If taking multiple medications, please check labels to avoid duplicating the same active ingredients  **take medication as directed on the label  ** Do not exceed 4000 mg of tylenol in 24 hours  **Do not take medications that contain aspirin or ibuprofen  Commonly Asked Questions During Pregnancy  How Will I Feel When I'm Pregnant? Pregnancy symptoms in the first trimester of pregnancy may not appear until the middle or end of the second month. Hormonal changes will cause tenderness in your breasts, and you may begin to feel more tired than usual. Food cravings, an increase in the need to urinate, and morning sickness may all be more noticeable.  Pregnancy symptoms in the second trimester are more prominent. You may start to feel the baby move and become more active. Dental issues, nasal/sinus problems, and skin irritations can begin to appear.  Heartburn, leg cramps, dizziness, and a vaginal discharge are also common. Every woman is different when it comes to the symptoms they experience, and some may not experience any at all. Pregnancy symptoms in the third trimester can include increased frequency in urination, leg cramps, constipation, ligament pain in the abdomen, and weight gain. Back pain and Braxton Hicks contractions will become increasingly more common.  Why is nutrition during pregnancy important? Eating well is one of the best things you can do during pregnancy. Good nutrition helps you handle the extra demands on your body as your pregnancy progresses. The goal is to balance getting enough nutrients to support the growth of your fetus and maintaining a healthy weight.  How much water should I drink during pregnancy? During pregnancy you should drink 8 to 12 cups (64 to 96 ounces) of water every day. Water has many benefits. It aids digestion and helps form the amniotic fluid around the fetus. Water also helps nutrients circulate in the body and helps waste leave the body.  What can I do to help with nausea? Eat dry toast or crackers in the morning before you get out of bed to avoid moving around on an empty stomach. Eat five or six "mini meals" a day to ensure that your stomach is never empty. Eat frequent bites of foods like nuts, fruits, or crackers.  What can help with constipation during pregnancy? Constipation is common near the end of pregnancy. Eating more foods with fiber can help fight constipation. Fiber is found in fruits, vegetables, whole grains, beans, nuts, and seeds. You should aim for about 25 grams of fiber in your diet each day. Drink a lot of water as you increase your fiber intake.  How much coffee can I drink while I'm pregnant? Research suggests that moderate caffeine consumption (less than 200 milligrams per day) does not cause miscarriage or preterm birth. That's the amount in one 12-ounce cup of  coffee. Remember that caffeine also is found in tea, chocolate, energy drinks, and soft drinks. Caffeine can interfere with sleep and contribute to nausea and light-headedness. Caffeine also can increase urination and lead to dehydration.  What can I do to prevent or ease back pain during pregnancy? There are several things you can do to prevent or ease back pain. For example, wear supportive clothing and shoes. Pay attention to your position when sitting, sleeping, and lifting things. If you need to stand for a long time, rest one foot on a stool or a box to take the strain off your back. You also can use heat or cold to soothe sore muscles.  Is it safe to exercise during pregnancy? If you are healthy and your pregnancy is normal, it is safe to continue or start regular physical activity. Physical activity does not increase your risk of miscarriage, low birth weight, or early delivery. It's still important to discuss exercise with your  ob-gyn provider during your early prenatal visits.   What are the benefits of exercise during pregnancy? Regular exercise during pregnancy benefits you and your fetus in these key ways: Reduces back pain Eases constipation May decrease your risk of gestational diabetes, preeclampsia, and cesarean birth Promotes healthy weight gain during pregnancy Improves your overall fitness and strengthens your heart and blood vessels Helps you to lose the baby weight after your baby is born  Is it safe to dye my hair during pregnancy? Yes, it's safe. Only a small amount of chemicals from hair dye is absorbed through the scalp.  Is it safe to keep a cat during pregnancy? Yes, you can keep your cat. You may have heard that cat feces can carry the infection toxoplasmosis. This infection is only found in cats who go outdoors and hunt prey, such as mice and other rodents. If you do have a cat who goes outdoors or eats prey, have someone else take over daily cleaning the litter  box. This will keep you away from any cat feces. If you have an indoor cat who only eats cat food and doesn't have contact with outside animals, your risk of toxoplasmosis is very low.  What substances should I avoid during pregnancy? During pregnancy, women should not use tobacco, alcohol, marijuana, illegal drugs, or prescription medications for nonmedical reasons. Avoiding these substances and getting regular prenatal care are important to having a healthy pregnancy and a healthy baby.   What foods to I need to avoid in pregnancy? To help prevent listeriosis, avoid eating the following foods while you are pregnant: Unpasteurized milk and foods made with unpasteurized milk, including soft cheeses Hot dogs and luncheon meats, unless they are heated until steaming hot just before serving Unwashed raw produce such as fruits and vegetables  Avoid all raw and undercooked seafood, eggs, meat, and poultry while you are pregnant. Do not eat sushi made with raw fish (cooked sushi is safe). Cooking and pasteurization are the only ways to kill Listeria.  Limit your exposure to mercury by not eating bigeye tuna, king mackerel, marlin, orange roughy, shark, swordfish, or tilefish. Limit eating white (albacore) tuna to 6 ounces a week. You do not have to avoid all fish during pregnancy. In fact, fish and shellfish are nutritious foods with vital nutrients for a pregnant woman and her fetus. Be sure to eat at least 8-12 ounces of low-mercury fish and shellfish per week.  Is travel safe to during pregnancy? In most cases, pregnant women can travel safely until close to their due dates. But travel may not be recommended for women who have pregnancy complications. If you are planning a trip, talk with your (ob-gyn) provider. And no matter how you choose to travel, think ahead about your comfort and safety.  Can I use a sauna or hot tub early in pregnancy? It's best not to. Your core body temperature rises when  you use saunas and hot tubs. This rise in temperature can be harmful for your fetus.  Can I get a massage while pregnant? Yes. Massage is a good way to relax and improve circulation. The best position for a massage while you're pregnant is lying on your side, rather than facedown. Some massage tables have a cut-out for the belly, allowing you to lie facedown comfortably. Tell your massage therapist that you're pregnant if you're not showing yet. Many health spas offer special prenatal massages done by therapists who are trained to work on pregnant women.  Is Having  Dental Work While Pregnant Safe? Pregnancy and dental work questions are common for expecting moms. Preventive dental cleanings and annual exams during pregnancy are not only safe but are recommended. The rise in hormone levels during pregnancy causes the gums to swell, bleed, and trap food causing increased irritation to your gums. Preventive dental work while pregnant is essential to avoid oral infections such as gum disease, which has been linked to preterm birth. The American Dental Association (ADA) recommends pregnant women eat a balanced diet, brush their teeth thoroughly with ADA-approved fluoride toothpaste twice a day, and floss daily. Have preventive exams and cleanings during your pregnancy. Let your dentist know you are pregnant. Postpone non-emergency dental work until the second trimester or after delivery, if possible. Elective procedures should be postponed until after the delivery.      We highly recommend childbirth education to help you plan for labor and begin practicing coping skills (which will be needed with or without pain meds).  [place] Childbirth Education Options: Sign up by visiting [email or web address]  Childbirth ~ Self-Paced eClass (English and Spanish) This online class offers you the freedom to complete a childbirth education series in the comfort of your own home at your own pace.  Childbirth  Class (In-Person 4-Week Series  or on Saturdays, Virtual 4-Week Series ~ [place]) This interactive in-person class series will help you and your partner prepare for your birth experience. Topics include: Labor & Birth, Comfort Measures, Breathing Techniques, Massage, Medical Interventions, Pain Management Options, Cesarean Birth, Postpartum Care, and Newborn Care  Comfort Techniques for Labor ~ In-Person Class [facility]) This interactive class is designed for parents-to-be who want to learn & practice hands-on skills to help relieve some of the discomfort of labor and encourage their babies to rotate toward the best position for birth. Moms and their partners will be able to try a variety of labor positions with birth balls and rebozos as well as practice breathing, relaxation, and visualization techniques.  Natural Childbirth Class (In-Person 5-Week Series, In-Person on Saturdays or Virtual 5-Week Series ~ [place]) This class series is designed for expectant parents who want to learn and practice natural methods of coping with the process of labor and childbirth.  Cesarean Birth Self-Paced eClass (English and Spanish) This online course provides comprehensive information you can trust as you prepare for a possible cesarean birth. In this class, you'll learn how to make your birth and recovery comfortable and joyful through instructive video clips, animations, and activities.  Waterbirth ~ Airline pilot Interested in a waterbirth? In addition to a consultation with your credentialed waterbirth provider, this free, informational online class will help you discover whether waterbirth is the right fit for you. Not all obstetrical practices offer waterbirth, so check with your healthcare provider.  Tour Probation officer) - [facility] [employer] our 4 minute video tour of [facility] located in [place].    Parenting Education  Options:  Pregnancy 101 (Virtual) Congratulations on your pregnancy! This class is geared toward moms in their first trimester, but everyone is welcome. We are excited to guide you through all aspects of supporting a healthy pregnancy. You will learn what to expect at routine prenatal care appointments, common postpartum adjustments, basic infant safety, and breastfeeding.  Successful Partnering & Parenting ~ In-Person Workshop [facility]) This workshop inspires and equips partners of all economic levels, ages, and cultures to confidently care for their infants, support the birthing persons, and navigate their own transformations into  new partners and parents. Learning activities are geared towards supporting partner, but moms are welcome to attend.  'Baby & Me' Parenting Group (Virtual on Wednesdays at 11am) Enjoy this time discussing newborn & infant parenting topics and family adjustment issues with other new parents in a relaxed environment. Each week brings a new speaker or baby-centered activity. This group offers support and connection to parents as they journey through the adjustments and struggles of that sometimes overwhelming first year after the birth of a child.  Baby Safety, CPR, & Choking Class ~ Virtual This life-saving information is meant to encourage parents as they learn important safety and prevention tips as well as infant CPR and relief of choking.  Breastfeeding Class (In-Person in [place] or [employer]) Families learn what to expect in the first days and weeks of breastfeeding your newborn. IF YOU ARE AN EMPLOYEE TAKING THIS CLASS FOR CREDIT, DO NOT register yourself. Please e-mail [email or web address].   Breastfeeding Self-Paced eClass (English & Spanish) Families learn what to expect in the first days and weeks of breastfeeding your newborn.  Caring for Baby ~ In-Person, Virtual or Self-Paced Class This in-person class is for both expectant and adoptive parents  who want to learn and practice the most up-to-date newborn care for their babies. Focus is on birth through the first six weeks of life.  CPR & Choking Relief for Infants & Children ~ In-Person Class [facility]) This in-person course is designed for any parent, expectant parent, or adult who cares for infants or children. Participants learn and demonstrate cardiopulmonary resuscitation and choking relief procedures for both infants and children.  Grandparent Love ~ In-Person Class Grandparents will learn the most updated infant care and safety recommendations. They will discover ways to support their own children during the transition into the parenting role and receive tips on communicating with the new parents.  [place] Parenting Support Group Options:  Bereavement Grief Support Group (Pregnancy/Infant Loss) - Virtual This is an ongoing experience that meets once a month and is designed to help you honor the past, assist you in discovering tools to strengthen you today, and aid you in developing hope for the future.  Breastfeeding & Pumping Support Group (In-Person on Thursdays at 12pm or Virtual on Tuesdays at 5pm) Join [place] in-person each Thursday starting June 1st, 2023 at 12pm! This support group is free for all families looking for breastfeeding and/or pumping support.   Community-Based Childbirth Education Options:  [facility] Department Classes:  Childbirth education classes can help you get ready for a positive parenting experience. You can also meet other expectant parents and get free stuff for your baby. Each class runs for five weeks on the same night and costs $45 for the mother-to-be and her support person. Medicaid covers the cost if you are eligible. Call [phone] to register.  [employer] offers a variety of programs for the [employer] and is another great way to get connected. Please go to [email or web address] for more  information.  Childbirth With A Twist! Be informed of your options, get educated on birth, understand what your body is doing, learn how to cope, and have a lot of fun and laughs all while doing it either from the comfort of your couch OR in our cozy office and classroom space near the [place]. If you are taking a virtual class, then class is taught LIVE, so you can ask questions and receive answers in real-time from an experienced doula and childbirth educator.  This  virtual childbirth education class will meet for five instruction times online.  Although we are based in [place], [place], this virtual class is open to anyone in the world. Please visit: [email or web address] for more information.  Books We Love: The Doula Guide to Childbirth by Harland German and Otila Back The First-Time Parent's Childbirth Handbook by Dr. Amie Critchley, CNM The Birth Partner by Truddie Crumble

## 2023-10-08 NOTE — MAU Provider Note (Signed)
 Chief Complaint:  Possible Pregnancy, Emesis, and Abdominal Pain   HPI    Kelly Ibarra is a 18 y.o. G1P0 at [redacted]w[redacted]d who presents to maternity admissions reporting that she was on the phone earlier trying to establish a NOB appointment when she was told to report to the MAU for c/o N/V and per patient a weight loss of ~20 lbs " since becoming pregnant. She denies any VB,LOF, cramping. Reports LMP was 08/27/23. Pregnancy is un established with [place] but patient reports UPT positive ~09/20/23   Pregnancy Course: Un established - Interested in [facility]  Past Medical History:  Diagnosis Date   Anxiety    Asthma    Depression    Vision abnormalities    OB History  Gravida Para Term Preterm AB Living  1       SAB IAB Ectopic Multiple Live Births          # Outcome Date GA Lbr Len/2nd Weight Sex Type Anes PTL Lv  1 Current            History reviewed. No pertinent surgical history. Family History  Problem Relation Age of Onset   Asthma Mother    Allergic rhinitis Mother    Diabetes Father    Social History   Tobacco Use   Smoking status: Never    Passive exposure: Current   Smokeless tobacco: Never  Vaping Use   Vaping status: Every Day   Substances: Nicotine, Flavoring  Substance Use Topics   Alcohol use: Not Currently   Drug use: Yes    Types: Marijuana    Comment: Reports smokes every other day.   No Known Allergies No medications prior to admission.    I have reviewed patient's Past Medical Hx, Surgical Hx, Family Hx, Social Hx, medications and allergies.   ROS  Pertinent items noted in HPI and remainder of comprehensive ROS otherwise negative.   PHYSICAL EXAM  Patient Vitals for the past 24 hrs:  BP Temp Temp src Pulse Resp SpO2 Height Weight  10/08/23 1737 -- 98 F (36.7 C) Oral -- -- -- -- --  10/08/23 1430 100/64 98.2 F (36.8 C) Oral (!) 58 16 100 % 5\' 6"  (1.676 m) 56.7 kg    Constitutional: Well-developed, thin female in no acute distress.   Cardiovascular: mild hypotension with maternal bradycardia noted above Respiratory: normal effort, no problems with respiration noted GI: Abd soft, non-tender, no guarding, no rebound, no rigidity on palpation MS: Extremities nontender, no edema, normal ROM Neurologic: Alert and oriented x 4.  GU: no CVA tenderness Pelvic: Deferred      Fetal Tracing: See Imaging  ( IUP with FHR @ 122)     Labs: Results for orders placed or performed during the hospital encounter of 10/08/23 (from the past 24 hours)  Urinalysis, Routine w reflex microscopic -Urine, Clean Catch     Status: Abnormal   Collection Time: 10/08/23  2:35 PM  Result Value Ref Range   Color, Urine YELLOW YELLOW   APPearance CLEAR CLEAR   Specific Gravity, Urine 1.018 1.005 - 1.030   pH 6.0 5.0 - 8.0   Glucose, UA NEGATIVE NEGATIVE mg/dL   Hgb urine dipstick NEGATIVE NEGATIVE   Bilirubin Urine NEGATIVE NEGATIVE   Ketones, ur 20 (A) NEGATIVE mg/dL   Protein, ur NEGATIVE NEGATIVE mg/dL   Nitrite NEGATIVE NEGATIVE   Leukocytes,Ua NEGATIVE NEGATIVE  Pregnancy, urine POC     Status: Abnormal   Collection Time: 10/08/23  2:37 PM  Result Value Ref Range   Preg Test, Ur POSITIVE (A) NEGATIVE  CBC     Status: Abnormal   Collection Time: 10/08/23  3:08 PM  Result Value Ref Range   WBC 4.6 4.0 - 10.5 K/uL   RBC 4.40 3.87 - 5.11 MIL/uL   Hemoglobin 12.2 12.0 - 15.0 g/dL   HCT 40.9 (L) 81.1 - 91.4 %   MCV 80.0 80.0 - 100.0 fL   MCH 27.7 26.0 - 34.0 pg   MCHC 34.7 30.0 - 36.0 g/dL   RDW 78.2 95.6 - 21.3 %   Platelets 249 150 - 400 K/uL   nRBC 0.0 0.0 - 0.2 %  hCG, quantitative, pregnancy     Status: Abnormal   Collection Time: 10/08/23  3:08 PM  Result Value Ref Range   hCG, Beta Chain, Quant, S 102,291 (H) <5 mIU/mL  ABO/Rh     Status: None   Collection Time: 10/08/23  3:08 PM  Result Value Ref Range   ABO/RH(D)      A POS Performed at [facility], [address] [place], [place] [address]     Imaging:   No results found.  MDM & MAU COURSE  MDM:  HIGH   Labs/ BMP pending Ultrasound PO Hydration Antiemetics   I have reviewed the patient chart and performed the physical exam . I have ordered & interpreted the lab results and reviewed and interpreted the  [place] images Medications ordered as stated below.  A/P as described below.  Counseling and education provided and patient agreeable  with plan as described below. Verbalized understanding.    MAU Course: Orders Placed This Encounter  Procedures   US OB LESS THAN 14 WEEKS WITH OB TRANSVAGINAL   Urinalysis, Routine w reflex microscopic -Urine, Clean Catch   CBC   hCG, quantitative, pregnancy   Basic metabolic panel   Pregnancy, urine POC   ABO/Rh   Discharge patient Discharge disposition: 01-Home or Self Care; Discharge patient date: 10/08/2023   Meds ordered this encounter  Medications   ondansetron (ZOFRAN-ODT) disintegrating tablet 8 mg   Prenatal Vit-Fe Fumarate-FA (PRENATAL COMPLETE) 14-0.4 MG TABS    Sig: Take 1 tablet by mouth daily.    Dispense:  60 tablet    Refill:  3    Supervising Provider:   Reva Bores [[id number]]   ondansetron (ZOFRAN-ODT) 4 MG disintegrating tablet    Sig: Take 1 tablet (4 mg total) by mouth every 8 (eight) hours as needed for nausea or vomiting.    Dispense:  20 tablet    Refill:  0    Supervising Provider:   Reva Bores [[id number]]    ASSESSMENT   1. Nausea and vomiting during pregnancy   2. Normal intrauterine pregnancy on prenatal ultrasound in first trimester     PLAN  Discharge home in stable condition with return precautions.  Increase PO Hydration Patient verbalized understanding and is agreeable with the plan as described  Please see AVS for detailed discharge instructions with verbal and written education provided   Future Appointments  Date Time Provider Department Center  10/09/2023 10:30 AM Arabella Merles, CNM [facility]  10/25/2023 10:45 AM Alfonse Spruce, MD  [facility] None      Allergies as of 10/08/2023   No Known Allergies      Medication List     STOP taking these medications    ondansetron 4 MG tablet Commonly known as: ZOFRAN   pantoprazole 40 MG  tablet Commonly known as: PROTONIX       TAKE these medications    albuterol 108 (90 Base) MCG/ACT inhaler Commonly known as: VENTOLIN HFA Inhale 2 puffs into the lungs every 6 (six) hours as needed for wheezing or shortness of breath.   CeraVe Moisturizing Crea Apply 1 Application topically daily.   CERAVE SA BODY WASH EX Apply 1 Application topically daily.   cetirizine 10 MG tablet Commonly known as: ZYRTEC Take 1 tablet (10 mg total) by mouth daily.   EPINEPHrine 0.3 mg/0.3 mL Soaj injection Commonly known as: EPI-PEN Inject 0.3 mg into the muscle as needed for anaphylaxis.   fluticasone 110 MCG/ACT inhaler Commonly known as: Flovent HFA Inhale 2 puffs into the lungs in the morning and at bedtime.   ondansetron 4 MG disintegrating tablet Commonly known as: ZOFRAN-ODT Take 1 tablet (4 mg total) by mouth every 8 (eight) hours as needed for nausea or vomiting.   Prenatal Complete 14-0.4 MG Tabs Take 1 tablet by mouth daily.   Spacer/Aero-Holding [employer] Use spacer as directed with inhaler.        Marcell Barlow, MSN, [facility], [facility] for [employer]

## 2023-10-08 NOTE — MAU Note (Signed)
 Kelly Ibarra is a 18 y.o. at Unknown here in MAU reporting: pt states is pregnant, has been throwing up- has lost like 20 lbs.  Spoke to someone at [facility], was instructed to come here. Preg has not been confirmed.  Pain in lower abd when she throws up. Denies bleeding.  LMP: 1/21 Onset of complaint: wk or 2 Pain score: mild, abd Vitals:   10/08/23 1430  BP: 100/64  Pulse: (!) 58  Resp: 16  Temp: 98.2 F (36.8 C)  SpO2: 100%      Lab orders placed from triage:  UPT/ [place] if +

## 2023-10-09 ENCOUNTER — Encounter: Payer: Self-pay | Admitting: Advanced Practice Midwife

## 2023-10-09 ENCOUNTER — Ambulatory Visit: Admitting: Advanced Practice Midwife

## 2023-10-09 VITALS — BP 111/78 | HR 91 | Ht 66.0 in | Wt 128.4 lb

## 2023-10-09 DIAGNOSIS — Z3201 Encounter for pregnancy test, result positive: Secondary | ICD-10-CM

## 2023-10-09 DIAGNOSIS — Z3490 Encounter for supervision of normal pregnancy, unspecified, unspecified trimester: Secondary | ICD-10-CM

## 2023-10-09 DIAGNOSIS — Z363 Encounter for antenatal screening for malformations: Secondary | ICD-10-CM

## 2023-10-09 LAB — ABO/RH: ABO/RH(D): A POS

## 2023-10-09 LAB — POCT URINE PREGNANCY: Preg Test, Ur: POSITIVE — AB

## 2023-10-09 NOTE — Progress Notes (Addendum)
 GYN VISIT Patient name: Kelly Ibarra MRN [id number]  Date of birth: [birth year] Chief Complaint:   new gyn (Pregnancy confirmation. 6+1 EDC  05-31-24)  History of Present Illness:   Kelly Ibarra is a 18 y.o. G1P0 African-American female being seen today for amenorrhea and +UPT. .    Patient's last menstrual period was 08/27/2023. The current method of family planning is none.  Last pap <21yo. Results were: N/A     10/09/2023   10:25 AM 05/17/2022   11:13 AM 03/13/2022    9:01 AM 01/30/2022   10:04 AM 01/30/2022   10:00 AM  Depression screen PHQ 2/9  Decreased Interest 2      Down, Depressed, Hopeless 1      PHQ - 2 Score 3      Altered sleeping 3      Tired, decreased energy 3      Change in appetite 3      Feeling bad or failure about yourself  1      Trouble concentrating 1      Moving slowly or fidgety/restless 0      Suicidal thoughts 0      PHQ-9 Score 14      Difficult doing work/chores          Information is confidential and restricted. Go to Review Flowsheets to unlock data.        10/09/2023   10:26 AM 01/30/2022   10:00 AM 09/11/2021    3:11 PM 08/29/2021    3:21 PM  GAD 7 : Generalized Anxiety Score  Nervous, Anxious, on Edge 2     Control/stop worrying 1     Worry too much - different things 0     Trouble relaxing 1     Restless 0     Easily annoyed or irritable 2     Afraid - awful might happen 2     Total GAD 7 Score 8     Anxiety Difficulty         Information is confidential and restricted. Go to Review Flowsheets to unlock data.     Review of Systems:   Pertinent items are noted in HPI Denies fever/chills, dizziness, headaches, visual disturbances, fatigue, shortness of breath, chest pain, abdominal pain, vomiting, abnormal vaginal discharge/itching/odor/irritation, problems with periods, bowel movements, urination, or intercourse unless otherwise stated above.  Pertinent History Reviewed:  Reviewed past medical,surgical, social, obstetrical and  family history.  Reviewed problem list, medications and allergies. Physical Assessment:   Vitals:   10/09/23 1031  BP: 111/78  Pulse: 91  Weight: 128 lb 6.4 oz (58.2 kg)  Height: 5\' 6"  (1.676 m)  Body mass index is 20.72 kg/m.       Physical Examination:   General appearance: alert, well appearing, and in no distress  Mental status: alert, oriented to person, place, and time  Skin: warm & dry   Cardiovascular: normal heart rate noted  Respiratory: normal respiratory effort, no distress  Abdomen: soft, non-tender   Pelvic: examination not indicated  Extremities: no edema    Results for orders placed or performed in visit on 10/09/23 (from the past 24 hours)  POCT urine pregnancy   Collection Time: 10/09/23 10:40 AM  Result Value Ref Range   Preg Test, Ur Positive (A) Negative  Results for orders placed or performed during the hospital encounter of 10/08/23 (from the past 24 hours)  Urinalysis, Routine w reflex microscopic -Urine, Clean Catch   Collection Time:  10/08/23  2:35 PM  Result Value Ref Range   Color, Urine YELLOW YELLOW   APPearance CLEAR CLEAR   Specific Gravity, Urine 1.018 1.005 - 1.030   pH 6.0 5.0 - 8.0   Glucose, UA NEGATIVE NEGATIVE mg/dL   Hgb urine dipstick NEGATIVE NEGATIVE   Bilirubin Urine NEGATIVE NEGATIVE   Ketones, ur 20 (A) NEGATIVE mg/dL   Protein, ur NEGATIVE NEGATIVE mg/dL   Nitrite NEGATIVE NEGATIVE   Leukocytes,Ua NEGATIVE NEGATIVE  Pregnancy, urine POC   Collection Time: 10/08/23  2:37 PM  Result Value Ref Range   Preg Test, Ur POSITIVE (A) NEGATIVE  Basic metabolic panel   Collection Time: 10/08/23  3:07 PM  Result Value Ref Range   Sodium 135 135 - 145 mmol/L   Potassium 3.3 (L) 3.5 - 5.1 mmol/L   Chloride 102 98 - 111 mmol/L   CO2 22 22 - 32 mmol/L   Glucose, Bld 85 70 - 99 mg/dL   BUN 7 6 - 20 mg/dL   Creatinine, Ser 0.86 0.44 - 1.00 mg/dL   Calcium 9.3 8.9 - 57.8 mg/dL   GFR, Estimated >46 >96 mL/min   Anion gap 11 5 - 15   CBC   Collection Time: 10/08/23  3:08 PM  Result Value Ref Range   WBC 4.6 4.0 - 10.5 K/uL   RBC 4.40 3.87 - 5.11 MIL/uL   Hemoglobin 12.2 12.0 - 15.0 g/dL   HCT 29.5 (L) 28.4 - 13.2 %   MCV 80.0 80.0 - 100.0 fL   MCH 27.7 26.0 - 34.0 pg   MCHC 34.7 30.0 - 36.0 g/dL   RDW 44.0 10.2 - 72.5 %   Platelets 249 150 - 400 K/uL   nRBC 0.0 0.0 - 0.2 %  hCG, quantitative, pregnancy   Collection Time: 10/08/23  3:08 PM  Result Value Ref Range   hCG, Beta Chain, Quant, S 102,291 (H) <5 mIU/mL  ABO/Rh   Collection Time: 10/08/23  3:08 PM  Result Value Ref Range   ABO/RH(D) A POS    No rh immune globuloin      NOT A RH IMMUNE GLOBULIN CANDIDATE, PT RH POSITIVE Performed at [facility], [address], [place], [place] [address]     Assessment & Plan:  1) [redacted]w[redacted]d IUP> had MAU visit yesterday and feels better after receiving Zofran ; has rx sent in which she will pick up today; also had PNV sent in; will scheduled for NOB and NT in 6wks  Meds: No orders of the defined types were placed in this encounter.   Orders Placed This Encounter  Procedures   US  Fetal Nuchal Translucency Measurement   POCT urine pregnancy    Return in about 6 weeks (around 11/20/2023) for intake w/ Tish & new OB, US : NT+1st IT.  Jolayne Natter CNM 10/09/2023 10:50 AM

## 2023-10-10 ENCOUNTER — Other Ambulatory Visit: Payer: Self-pay | Admitting: Nurse Practitioner

## 2023-10-18 ENCOUNTER — Other Ambulatory Visit (INDEPENDENT_AMBULATORY_CARE_PROVIDER_SITE_OTHER): Payer: Self-pay | Admitting: Nurse Practitioner

## 2023-10-18 ENCOUNTER — Other Ambulatory Visit: Payer: Self-pay | Admitting: Nurse Practitioner

## 2023-10-21 ENCOUNTER — Telehealth: Payer: Self-pay | Admitting: *Deleted

## 2023-10-21 MED ORDER — ONDANSETRON 4 MG PO TBDP: 4.0000 mg | ORAL_TABLET | Freq: Three times a day (TID) | ORAL | 2 refills | Status: DC | PRN

## 2023-10-21 NOTE — Telephone Encounter (Signed)
 Rx sent in for zofran 4 mg ODT

## 2023-10-21 NOTE — Telephone Encounter (Signed)
 Patient called stating she has been vomiting since yesterday and is noticing blood in the vomit. She was prescribed Zofran in the ER but is out of the medication.  Requesting refill on zofran.  Advised to check back with pharmacy later this afternoon.

## 2023-10-25 ENCOUNTER — Other Ambulatory Visit: Payer: Self-pay

## 2023-10-25 ENCOUNTER — Ambulatory Visit (INDEPENDENT_AMBULATORY_CARE_PROVIDER_SITE_OTHER): Payer: Medicaid Other | Admitting: Allergy & Immunology

## 2023-10-25 ENCOUNTER — Encounter: Payer: Self-pay | Admitting: Allergy & Immunology

## 2023-10-25 VITALS — BP 100/64 | HR 92 | Temp 98.1°F | Resp 12 | Wt 132.4 lb

## 2023-10-25 DIAGNOSIS — J45998 Other asthma: Secondary | ICD-10-CM

## 2023-10-25 DIAGNOSIS — L501 Idiopathic urticaria: Secondary | ICD-10-CM | POA: Diagnosis not present

## 2023-10-25 DIAGNOSIS — Z91018 Allergy to other foods: Secondary | ICD-10-CM | POA: Diagnosis not present

## 2023-10-25 DIAGNOSIS — J3089 Other allergic rhinitis: Secondary | ICD-10-CM

## 2023-10-25 DIAGNOSIS — J45909 Unspecified asthma, uncomplicated: Secondary | ICD-10-CM

## 2023-10-25 DIAGNOSIS — L2089 Other atopic dermatitis: Secondary | ICD-10-CM | POA: Diagnosis not present

## 2023-10-25 DIAGNOSIS — T7807XD Anaphylactic reaction due to milk and dairy products, subsequent encounter: Secondary | ICD-10-CM

## 2023-10-25 MED ORDER — CETIRIZINE HCL 10 MG PO TABS: 10.0000 mg | ORAL_TABLET | Freq: Every day | ORAL | 1 refills | Status: AC

## 2023-10-25 NOTE — Progress Notes (Signed)
 FOLLOW UP  Date of Service/Encounter:  10/25/23   Assessment:   Pregnancy complicating asthma in the first trimester.    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  - Your shortness of breath could be related to your pregnancy, but it could be your asthma acting up.  - Flovent is safe to use during pregnancy. - Daily controller medication(s): Flovent 2 puffs twice daily with spacer - Prior to physical activity: albuterol 2 puffs 10-15 minutes before physical activity. - Rescue medications: albuterol 4 puffs every 4-6 hours as needed - Asthma control goals:  * Full participation in all desired activities (may need albuterol before activity) * Albuterol use two time or less a week on average (not counting use with activity) * Cough interfering with sleep two time or less a month * Oral steroids no more than once a year * No hospitalizations  Allergic rhinitis (dust mites, cats, dog) - Continue cetirizine 10 mg once a day as needed for runny nose or itch. - Continue saline nasal rinses as needed for nasal symptoms. Use this before any medicated nasal sprays for best result  Urticaria - Continue cetirizine 10 mg once a day as needed for hives or itch. - This is safe to use during pregnancy as well.   Atopic dermatitis - Continue a twice a day moisturizer  Alpha gal allergy - Continue to avoid mammalian meat and cow's milk (ok to continue with cheese since you are eating it without a problem).  - We are rechecking the alpha gal panel today. - EpiPen is up to date in case you need it. - In case of an allergic reaction, take Benadryl 50 mg every 4 hours, and if life-threatening symptoms occur, inject with EpiPen 0.3 mg.  Return in about 3 months (around 01/25/2024). You can have the  follow up appointment with Dr. Dellis Anes or a Nurse Practicioner (our Nurse Practitioners are excellent and always have Physician oversight!).    Subjective:   Kelly Ibarra is a 18 y.o. female presenting today for follow up of  Chief Complaint  Patient presents with   Follow-up    Has been out of breath recently. Is [redacted]wks pregnant.  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  Discussed the use of AI scribe software for clinical note transcription with the patient and/or guardian, who gave verbal consent to proceed.  Kelly Ibarra is a 18 y.o. female presenting for a follow up visit. she was last seen in December 2024.  At that time, she was started on Flovent 110 mcg 2 puffs twice daily as well as albuterol.  For her allergic rhinitis, she continue with cetirizine.  For her urticaria, she was continued on Zyrtec 1-2 times a day.  Atopic dermatitis was controlled with moisturizing.  She continue to avoid red meat as  well as cows milk.  Since last visit, she has done well.   Asthma/Respiratory Symptom History: She is experiencing increased breathing difficulties during her pregnancy, particularly when walking up stairs or moving around in bed. She needs to catch her breath after such activities. There have been no recent emergency room visits for her breathing issues. This has all worsened since she found out that she was pregnant, but it is not bad enough for her to use her albuterol.  She uses Flovent, two puffs twice a night, which has helped with her coughing and wheezing. Her asthma has not worsened significantly during pregnancy.   Allergic Rhinitis Symptom History: She is also using cetirizine, which is safe during pregnancy. She has Flonase to use as needed.   Food Allergy Symptom History: She is managing an alpha-gal allergy by avoiding red meat and milk. Previous testing indicated an allergy to red meat, confirmed after experiencing breakouts. She is considering retesting her alpha-gal levels. She does have an up to date EpiPen.   Skin Symptom History: She has a history of hives and eczema, both currently under good control. Cetirizine is part of her management plan for hives.  She is currently [redacted] weeks pregnant, and this is her first pregnancy. She recently broke up with her boyfriend, who is the father of the child, and is uncertain about his involvement in the child's life.   Otherwise, there have been no changes to her past medical history, surgical history, family history, or social history.    Review of systems otherwise negative other than that mentioned in the HPI.    Objective:   Blood pressure 100/64, pulse 92, temperature 98.1 F (36.7 C), resp. rate 12, weight 132 lb 6 oz (60 kg), last menstrual period 08/27/2023, SpO2 98%. Body mass index is 21.37 kg/m.    Physical Exam Vitals reviewed.  Constitutional:      Appearance: She is well-developed.  HENT:     Head: Normocephalic and atraumatic.     Right Ear: Tympanic membrane, ear canal and external ear normal. No drainage, swelling or tenderness. Tympanic membrane is not injected, scarred, erythematous, retracted or bulging.     Left Ear: Tympanic membrane, ear canal and external ear normal. No drainage, swelling or tenderness. Tympanic membrane is not injected, scarred, erythematous, retracted or bulging.     Nose: No nasal deformity, septal deviation, mucosal edema or rhinorrhea.     Right  Turbinates: Enlarged, swollen and pale.     Left Turbinates: Enlarged, swollen and pale.     Right Sinus: No maxillary sinus tenderness or frontal sinus tenderness.     Left Sinus: No maxillary sinus tenderness or frontal sinus tenderness.     Mouth/Throat:     Mouth: Mucous membranes are not pale and not dry.     Pharynx: Uvula midline.  Eyes:     General:        Right eye: No discharge.        Left eye: No discharge.     Conjunctiva/sclera: Conjunctivae normal.     Right eye: Right conjunctiva is not injected. No chemosis.    Left eye: Left conjunctiva is not injected. No chemosis.    Pupils: Pupils are equal, round, and reactive to light.  Cardiovascular:     Rate and Rhythm: Normal rate and regular rhythm.     Heart sounds: Normal heart sounds.  Pulmonary:     Effort: Pulmonary effort is normal. No tachypnea, accessory muscle usage or respiratory distress.  Breath sounds: Normal breath sounds. No wheezing, rhonchi or rales.     Comments: Moving air well in all lung fields.  Chest:     Chest wall: No tenderness.  Abdominal:     Tenderness: There is no abdominal tenderness. There is no guarding or rebound.  Lymphadenopathy:     Head:     Right side of head: No submandibular, tonsillar or occipital adenopathy.     Left side of head: No submandibular, tonsillar or occipital adenopathy.     Cervical: No cervical adenopathy.  Skin:    Coloration: Skin is not pale.     Findings: No abrasion, erythema, petechiae or rash. Rash is not papular, urticarial or vesicular.  Neurological:     Mental Status: She is alert.  Psychiatric:        Behavior: Behavior is cooperative.      Diagnostic studies: none      Malachi Bonds, MD  [facility]

## 2023-10-25 NOTE — Patient Instructions (Addendum)
 Asthma - Lung testing looked great.  - Your shortness of breath could be related to your pregnancy, but it could be your asthma acting up.  - Flovent is safe to use during pregnancy. - Daily controller medication(s): Flovent 2 puffs twice daily with spacer - Prior to physical activity: albuterol 2 puffs 10-15 minutes before physical activity. - Rescue medications: albuterol 4 puffs every 4-6 hours as needed - Asthma control goals:  * Full participation in all desired activities (may need albuterol before activity) * Albuterol use two time or less a week on average (not counting use with activity) * Cough interfering with sleep two time or less a month * Oral steroids no more than once a year * No hospitalizations  Allergic rhinitis (dust mites, cats, dog) - Continue cetirizine 10 mg once a day as needed for runny nose or itch. - Continue saline nasal rinses as needed for nasal symptoms. Use this before any medicated nasal sprays for best result  Urticaria - Continue cetirizine 10 mg once a day as needed for hives or itch. - This is safe to use during pregnancy as well.   Atopic dermatitis - Continue a twice a day moisturizer  Alpha gal allergy - Continue to avoid mammalian meat and cow's milk (ok to continue with cheese since you are eating it without a problem).  - We are rechecking the alpha gal panel today. - EpiPen is up to date in case you need it. - In case of an allergic reaction, take Benadryl 50 mg every 4 hours, and if life-threatening symptoms occur, inject with EpiPen 0.3 mg.  Return in about 3 months (around 01/25/2024). You can have the follow up appointment with Dr. Dellis Anes or a Nurse Practicioner (our Nurse Practitioners are excellent and always have Physician oversight!).    Please inform us of any Emergency Department visits, hospitalizations, or changes in symptoms. Call us before going to the ED for breathing or allergy symptoms since we might be able to  fit you in for a sick visit. Feel free to contact us anytime with any questions, problems, or concerns.  It was a pleasure to see you again today!  Websites that have reliable patient information: 1. American Academy of Asthma, Allergy, and Immunology: www.aaaai.org 2. Food Allergy Research and Education (FARE): foodallergy.org 3. Mothers of Asthmatics: http://www.asthmacommunitynetwork.org 4. American College of Allergy, Asthma, and Immunology: www.acaai.org      "Like" [place] on Facebook and Instagram for our latest updates!      A healthy democracy works best when [employer] participate! Make sure you are registered to vote! If you have moved or changed any of your contact information, you will need to get this updated before voting! Scan the QR codes below to learn more!

## 2023-11-01 LAB — IGE MILK W/ COMPONENT REFLEX: F002-IgE Milk: 0.18 kU/L — AB

## 2023-11-02 LAB — ALPHA-GAL PANEL
Allergen Lamb IgE: <0.1 kU/L
Beef IgE: 0.16 kU/L — AB
IgE (Immunoglobulin E), Serum: 31 [IU]/mL (ref 6–495)
O215-IgE Alpha-Gal: 0.22 kU/L — AB
Pork IgE: 0.13 kU/L — AB

## 2023-11-05 ENCOUNTER — Encounter: Payer: Self-pay | Admitting: Allergy & Immunology

## 2023-11-06 NOTE — Telephone Encounter (Signed)
 Patient called back to confirm she did see Dr. Ellouise Newer MyChart message.

## 2023-11-19 ENCOUNTER — Encounter: Payer: Self-pay | Admitting: Obstetrics and Gynecology

## 2023-11-19 DIAGNOSIS — Z3400 Encounter for supervision of normal first pregnancy, unspecified trimester: Secondary | ICD-10-CM | POA: Insufficient documentation

## 2023-11-20 ENCOUNTER — Encounter: Payer: Self-pay | Admitting: Obstetrics and Gynecology

## 2023-11-20 ENCOUNTER — Ambulatory Visit: Admitting: *Deleted

## 2023-11-20 ENCOUNTER — Ambulatory Visit

## 2023-11-20 ENCOUNTER — Ambulatory Visit: Admitting: Obstetrics and Gynecology

## 2023-11-20 VITALS — BP 88/61 | HR 81 | Wt 136.0 lb

## 2023-11-20 DIAGNOSIS — O219 Vomiting of pregnancy, unspecified: Secondary | ICD-10-CM

## 2023-11-20 DIAGNOSIS — Z131 Encounter for screening for diabetes mellitus: Secondary | ICD-10-CM

## 2023-11-20 DIAGNOSIS — J45909 Unspecified asthma, uncomplicated: Secondary | ICD-10-CM

## 2023-11-20 DIAGNOSIS — Z3A12 12 weeks gestation of pregnancy: Secondary | ICD-10-CM

## 2023-11-20 DIAGNOSIS — Z3402 Encounter for supervision of normal first pregnancy, second trimester: Secondary | ICD-10-CM

## 2023-11-20 DIAGNOSIS — Z363 Encounter for antenatal screening for malformations: Secondary | ICD-10-CM

## 2023-11-20 DIAGNOSIS — Z3401 Encounter for supervision of normal first pregnancy, first trimester: Secondary | ICD-10-CM

## 2023-11-20 MED ORDER — ASPIRIN 81 MG PO TBEC: 81.0000 mg | DELAYED_RELEASE_TABLET | Freq: Every day | ORAL | 12 refills | Status: DC

## 2023-11-20 MED ORDER — DOXYLAMINE-PYRIDOXINE 10-10 MG PO TBEC: 2.0000 | DELAYED_RELEASE_TABLET | Freq: Every day | ORAL | 1 refills | Status: DC

## 2023-11-20 NOTE — Progress Notes (Signed)
 INITIAL PRENATAL VISIT  Subjective:   Kelly Ibarra is being seen today for her first obstetrical visit.  She is at [redacted]w[redacted]d gestation by LMP. Her obstetrical history is significant for  none . Relationship with FOB:  not involved . Patient does intend to breast feed. Pregnancy history fully reviewed.  Patient reports nausea.  Indications for ASA therapy (per uptodate) Two or more of the following: Nulliparity Yes Obesity (body mass index >30 kg/m2) No Family history of preeclampsia in mother or sister No Age >=35 years No Sociodemographic characteristics (African American race, low socioeconomic level) Yes Personal risk factors (eg, previous pregnancy with low birth weight or small for gestational age infant, previous adverse pregnancy outcome [eg, stillbirth], interval >10 years between pregnancies) No  Objective:    Obstetric History OB History  Gravida Para Term Preterm AB Living  1       SAB IAB Ectopic Multiple Live Births          # Outcome Date GA Lbr Len/2nd Weight Sex Type Anes PTL Lv  1 Current             Past Medical History:  Diagnosis Date   Anxiety    Asthma    Depression    Vision abnormalities     History reviewed. No pertinent surgical history.  Current Outpatient Medications on File Prior to Visit  Medication Sig Dispense Refill   albuterol (VENTOLIN HFA) 108 (90 Base) MCG/ACT inhaler Inhale 2 puffs into the lungs every 6 (six) hours as needed for wheezing or shortness of breath. 8 g 2   cetirizine (ZYRTEC) 10 MG tablet Take 1 tablet (10 mg total) by mouth daily. 90 tablet 1   fluticasone (FLOVENT HFA) 110 MCG/ACT inhaler Inhale 2 puffs into the lungs in the morning and at bedtime. 12 g 0   ondansetron (ZOFRAN-ODT) 4 MG disintegrating tablet Take 1 tablet (4 mg total) by mouth every 8 (eight) hours as needed for nausea or vomiting. 30 tablet 2   Prenatal Vit-Fe Fumarate-FA (PRENATAL COMPLETE) 14-0.4 MG TABS Take 1 tablet by mouth daily. 60 tablet 3    Spacer/Aero-Holding Chambers DEVI Use spacer as directed with inhaler. 1 each 0   Emollient (CERAVE MOISTURIZING) CREA Apply 1 Application topically daily. (Patient not taking: Reported on 10/09/2023)     EPINEPHrine 0.3 mg/0.3 mL IJ SOAJ injection Inject 0.3 mg into the muscle as needed for anaphylaxis. (Patient not taking: Reported on 11/20/2023)     Soap & Cleansers (CERAVE SA BODY WASH EX) Apply 1 Application topically daily. (Patient not taking: Reported on 11/20/2023)     No current facility-administered medications on file prior to visit.    Allergies  Allergen Reactions   Alpha-Gal Anaphylaxis    Social History:  reports that she has never smoked. She has been exposed to tobacco smoke. She has never used smokeless tobacco. She reports that she does not currently use alcohol. She reports that she does not currently use drugs after having used the following drugs: Marijuana.  Family History  Problem Relation Age of Onset   Asthma Mother    Allergic rhinitis Mother    Thyroid disease Mother    Diabetes Father     The following portions of the patient's history were reviewed and updated as appropriate: allergies, current medications, past family history, past medical history, past social history, past surgical history and problem list.  Review of Systems Review of Systems  Constitutional: Negative.   Respiratory: Negative.  Gastrointestinal:  Positive for nausea.  Neurological: Negative.   Psychiatric/Behavioral: Negative.        Physical Exam:  BP (!) 88/61   Pulse 81   Wt 136 lb (61.7 kg)   LMP 08/27/2023   BMI 21.95 kg/m  CONSTITUTIONAL: Well-developed, well-nourished female in no acute distress.  HENT:  Normocephalic, atraumatic.   EYES: Conjunctivae normal. NECK: Normal range of motion SKIN: Skin is warm and dry. MUSCULOSKELETAL: Normal range of motion NEUROLOGIC: Alert and oriented  PSYCHIATRIC: Normal mood and affect. Normal behavior.  RESPIRATORY: normal  effort ABDOMEN: Soft PELVIC:deferred    Assessment:    Pregnancy: G1P0  1. Encounter for supervision of normal first pregnancy in first trimester (Primary)  - Integrated 1 - Urine Culture - CHL AMB BABYSCRIPTS SCHEDULE OPTIMIZATION - CBC/D/Plt+RPR+Rh+ABO+RubIgG... - Hemoglobin A1c - HORIZON CUSTOM - PANORAMA PRENATAL TEST - GC/Chlamydia Probe Amp  2. [redacted] weeks gestation of pregnancy US  12+1 wks,CRL 67.36 mm,posterior fundal placenta,normal ovaries,NT 1.9 mm,NB present,FHR 150 bpm    - Integrated 1 - Urine Culture - CHL AMB BABYSCRIPTS SCHEDULE OPTIMIZATION - CBC/D/Plt+RPR+Rh+ABO+RubIgG... - Hemoglobin A1c - HORIZON CUSTOM - PANORAMA PRENATAL TEST - GC/Chlamydia Probe Amp  3. Diabetes mellitus screening  - Hemoglobin A1c  4. Nausea and vomiting during pregnancy Supportive measures discussed, can trial diclegis  - Doxylamine-Pyridoxine 10-10 MG TBEC; Take 2 tablets by mouth at bedtime. May also take 1 tab in am and 1 tab in afternoon  Dispense: 100 tablet; Refill: 1   5. Asthma Flovent BID, albuterol PRN    Plan:     Initial labs drawn. Prenatal vitamins. Problem list reviewed and updated. Reviewed in detail the nature of the practice with collaborative care between  Genetic screening discussed: NIPS/First trimester screen/Quad/AFP ordered. Role of ultrasound in pregnancy discussed; Anatomy US : ordered. Follow up in 4 weeks. Discussed clinic routines, schedule of care and testing, genetic screening options, involvement of students and residents under the direct supervision of APPs and doctors and presence of female providers. Pt verbalized understanding.  Return in 4 weeks for LOB, second IT  Zelma Hidden, FNP

## 2023-11-20 NOTE — Patient Instructions (Addendum)
 Considering Waterbirth? Guide for patients at [facility]) Why consider waterbirth? Gentle birth for babies  Less pain medicine used in labor  May allow for passive descent/less pushing  May reduce perineal tears  More mobility and instinctive maternal position changes  Increased maternal relaxation   Is waterbirth safe? What are the risks of infection, drowning or other complications? Infection:  Very low risk (3.7 % for tub vs 4.8% for bed)  7 in 8000 waterbirths with documented infection  Poorly cleaned equipment most common cause  Slightly lower group B strep transmission rate  Drowning  Maternal:  Very low risk  Related to seizures or fainting  Newborn:  Very low risk. No evidence of increased risk of respiratory problems in multiple large studies  Physiological protection from breathing under water  Avoid underwater birth if there are any fetal complications  Once baby's head is out of the water, keep it out.  Birth complication  Some reports of cord trauma, but risk decreased by bringing baby to surface gradually  No evidence of increased risk of shoulder dystocia. Mothers can usually change positions faster in water than in a bed, possibly aiding the maneuvers to free the shoulder.   There are 2 things you MUST do to have a waterbirth with [facility]: Attend a waterbirth class at [employer] [facility] at [facility]   3rd Wednesday of every month from 7-9 pm (virtual during COVID) [employer] at [email or web address] or [email or web address] or by calling [phone] Bring [place] the certificate from the class to your prenatal appointment or send via MyChart Meet with a midwife at 36 weeks* to see if you can still plan a waterbirth and to sign the consent.   *We also recommend that you schedule as many of your prenatal visits with a midwife as possible.    Helpful information: You may want to bring a bathing suit top to the hospital  to wear during labor but this is optional.  All other supplies are provided by the hospital. Please arrive at the hospital with signs of active labor, and do not wait at home until late in labor. It takes 45 min- 1 hour for fetal monitoring, and check in to your room to take place, plus transport and filling of the waterbirth tub.    Things that would prevent you from having a waterbirth: Premature, <37wks  Previous cesarean birth  Presence of thick meconium-stained fluid  Multiple gestation (Twins, triplets, etc.)  Uncontrolled diabetes or gestational diabetes requiring medication  Hypertension diagnosed in pregnancy or preexisting hypertension (gestational hypertension, preeclampsia, or chronic hypertension) Fetal growth restriction (your baby measures less than 10th percentile on ultrasound) Heavy vaginal bleeding  Non-reassuring fetal heart rate  Active infection (MRSA, etc.). Group B Strep is NOT a contraindication for waterbirth.  If your labor has to be induced and induction method requires continuous monitoring of the baby's heart rate  Other risks/issues identified by your obstetrical provider   Please remember that birth is unpredictable. Under certain unforeseeable circumstances your provider may advise against giving birth in the tub. These decisions will be made on a case-by-case basis and with the safety of you and your baby as our highest priority.    Updated 11/08/21 We highly recommend childbirth education to help you plan for labor and begin practicing coping skills (which will be needed with or without pain meds).  [place] Childbirth Education Options: Sign up by visiting [email or web address]  Childbirth ~ Self-Paced eClass (English and  Spanish) This online class offers you the freedom to complete a childbirth education series in the comfort of your own home at your own pace.  Childbirth Class (In-Person 4-Week Series  or on Saturdays, Virtual 4-Week Series ~  [place]) This interactive in-person class series will help you and your partner prepare for your birth experience. Topics include: Labor & Birth, Comfort Measures, Breathing Techniques, Massage, Medical Interventions, Pain Management Options, Cesarean Birth, Postpartum Care, and Newborn Care  Comfort Techniques for Labor ~ In-Person Class [facility]) This interactive class is designed for parents-to-be who want to learn & practice hands-on skills to help relieve some of the discomfort of labor and encourage their babies to rotate toward the best position for birth. Moms and their partners will be able to try a variety of labor positions with birth balls and rebozos as well as practice breathing, relaxation, and visualization techniques.  Natural Childbirth Class (In-Person 5-Week Series, In-Person on Saturdays or Virtual 5-Week Series ~ [place]) This class series is designed for expectant parents who want to learn and practice natural methods of coping with the process of labor and childbirth.  Cesarean Birth Self-Paced eClass (English and Spanish) This online course provides comprehensive information you can trust as you prepare for a possible cesarean birth. In this class, you'll learn how to make your birth and recovery comfortable and joyful through instructive video clips, animations, and activities.  Waterbirth ~ Airline pilot Interested in a waterbirth? In addition to a consultation with your credentialed waterbirth provider, this free, informational online class will help you discover whether waterbirth is the right fit for you. Not all obstetrical practices offer waterbirth, so check with your healthcare provider.  Tour Probation officer) - [facility] [employer] our 4 minute video tour of [facility] located in [place].   [place] Parenting Education Options:  Pregnancy 101 (Virtual) Congratulations on your pregnancy!  This class is geared toward moms in their first trimester, but everyone is welcome. We are excited to guide you through all aspects of supporting a healthy pregnancy. You will learn what to expect at routine prenatal care appointments, common postpartum adjustments, basic infant safety, and breastfeeding.  Successful Partnering & Parenting ~ In-Person Workshop [facility]) This workshop inspires and equips partners of all economic levels, ages, and cultures to confidently care for their infants, support the birthing persons, and navigate their own transformations into new partners and parents. Learning activities are geared towards supporting partner, but moms are welcome to attend.  'Baby & Me' Parenting Group (Virtual on Wednesdays at 11am) Enjoy this time discussing newborn & infant parenting topics and family adjustment issues with other new parents in a relaxed environment. Each week brings a new speaker or baby-centered activity. This group offers support and connection to parents as they journey through the adjustments and struggles of that sometimes overwhelming first year after the birth of a child.  Baby Safety, CPR, & Choking Class ~ Virtual This life-saving information is meant to encourage parents as they learn important safety and prevention tips as well as infant CPR and relief of choking.  Breastfeeding Class (In-Person in [place] or [employer]) Families learn what to expect in the first days and weeks of breastfeeding your newborn. IF YOU ARE AN EMPLOYEE TAKING THIS CLASS FOR CREDIT, DO NOT register yourself. Please e-mail [email or web address].   Breastfeeding Self-Paced eClass (English & Spanish) Families learn what to expect in the first days and weeks of breastfeeding your newborn.  Caring  for Baby ~ In-Person, Virtual or Self-Paced Class This in-person class is for both expectant and adoptive parents who want to learn and practice the most up-to-date newborn care for their  babies. Focus is on birth through the first six weeks of life.  CPR & Choking Relief for Infants & Children ~ In-Person Class [facility]) This in-person course is designed for any parent, expectant parent, or adult who cares for infants or children. Participants learn and demonstrate cardiopulmonary resuscitation and choking relief procedures for both infants and children.  Grandparent Love ~ In-Person Class Grandparents will learn the most updated infant care and safety recommendations. They will discover ways to support their own children during the transition into the parenting role and receive tips on communicating with the new parents.  [place] Parenting Support Group Options:  Bereavement Grief Support Group (Pregnancy/Infant Loss) - Virtual This is an ongoing experience that meets once a month and is designed to help you honor the past, assist you in discovering tools to strengthen you today, and aid you in developing hope for the future.  Breastfeeding & Pumping Support Group (In-Person on Thursdays at 12pm or Virtual on Tuesdays at 5pm) Join [place] in-person each Thursday starting June 1st, 2023 at 12pm! This support group is free for all families looking for breastfeeding and/or pumping support.   Community-Based Childbirth Education Options:  [facility] Department Classes:  Childbirth education classes can help you get ready for a positive parenting experience. You can also meet other expectant parents and get free stuff for your baby. Each class runs for five weeks on the same night and costs $45 for the mother-to-be and her support person. Medicaid covers the cost if you are eligible. Call [phone] to register.  [facility] [employer] [facility] offers a variety of programs for the [employer] and is another great way to get connected. Please go to [email or web address] for more information.  Childbirth With A Twist! Be informed of your options, get  educated on birth, understand what your body is doing, learn how to cope, and have a lot of fun and laughs all while doing it either from the comfort of your couch OR in our cozy office and classroom space near the [place] airport. If you are taking a virtual class, then class is taught LIVE, so you can ask questions and receive answers in real-time from an experienced doula and childbirth educator.  This virtual childbirth education class will meet for five instruction times online.  Although we are based in [place], [place], this virtual class is open to anyone in the world. Please visit: [email or web address] for more information.  Books We Love: The Doula Guide to Childbirth by Harland German and Otila Back The First-Time Parent's Childbirth Handbook by Dr. Amie Critchley, CNM The Birth Partner by Truddie Crumble                  Safe Medications in Pregnancy    Acne: Benzoyl Peroxide Salicylic Acid  Backache/Headache: Tylenol: 2 regular strength every 4 hours OR              2 Extra strength every 6 hours  Colds/Coughs/Allergies: Benadryl (alcohol free) 25 mg every 6 hours as needed Breath right strips Claritin Cepacol throat lozenges Chloraseptic throat spray Cold-Eeze- up to three times per day Cough drops, alcohol free Flonase (by prescription only) Guaifenesin Mucinex Robitussin DM (plain only, alcohol free) Saline nasal spray/drops Sudafed (pseudoephedrine) & Actifed ** use only after [redacted] weeks gestation and if  you do not have high blood pressure Tylenol Vicks Vaporub Zinc lozenges Zyrtec   Constipation: Colace Ducolax suppositories Fleet enema Glycerin suppositories Metamucil Milk of magnesia Miralax Senokot Smooth move tea  Diarrhea: Kaopectate Imodium A-D  *NO pepto Bismol  Hemorrhoids: Anusol Anusol HC Preparation H Tucks  Indigestion: Tums Maalox Mylanta Zantac  Pepcid  Insomnia: Benadryl (alcohol free) 25mg   every 6 hours as needed Tylenol PM Unisom, no Gelcaps  Leg Cramps: Tums MagGel  Nausea/Vomiting:  Bonine Dramamine Emetrol Ginger extract Sea bands Meclizine  Nausea medication to take during pregnancy:  Unisom (doxylamine succinate 25 mg tablets) Take one tablet daily at bedtime. If symptoms are not adequately controlled, the dose can be increased to a maximum recommended dose of two tablets daily (1/2 tablet in the morning, 1/2 tablet mid-afternoon and one at bedtime). Vitamin B6 100mg  tablets. Take one tablet twice a day (up to 200 mg per day).  Skin Rashes: Aveeno products Benadryl cream or 25mg  every 6 hours as needed Calamine Lotion 1% cortisone cream  Yeast infection: Gyne-lotrimin 7 Monistat 7   **If taking multiple medications, please check labels to avoid duplicating the same active ingredients **take medication as directed on the label ** Do not exceed 4000 mg of tylenol in 24 hours **Do not take medications that contain aspirin or ibuprofen

## 2023-11-20 NOTE — Progress Notes (Signed)
 US  12+1 wks,CRL 67.36 mm,posterior fundal placenta,normal ovaries,NT 1.9 mm,NB present,FHR 150 bpm

## 2023-11-22 LAB — URINE CULTURE

## 2023-11-22 LAB — GC/CHLAMYDIA PROBE AMP
Chlamydia trachomatis, NAA: NEGATIVE
Neisseria Gonorrhoeae by PCR: NEGATIVE

## 2023-11-23 LAB — INTEGRATED 1
Crown Rump Length: 67.4 mm
Gest. Age on Collection Date: 12.9 wk
Maternal Age at EDD: 20.2 a
Nuchal Translucency (NT): 1.9 mm
Number of Fetuses: 1
PAPP-A Value: 1416 ng/mL
Sonographer ID#: 309760
Weight: 136 [lb_av]

## 2023-11-23 LAB — CBC/D/PLT+RPR+RH+ABO+RUBIGG...
Antibody Screen: NEGATIVE
Basophils Absolute: 0 10*3/uL (ref 0.0–0.2)
Basos: 1 %
EOS (ABSOLUTE): 0 10*3/uL (ref 0.0–0.4)
Eos: 0 %
HCV Ab: NONREACTIVE
HIV Screen 4th Generation wRfx: NONREACTIVE
Hematocrit: 34.5 % (ref 34.0–46.6)
Hemoglobin: 11.4 g/dL (ref 11.1–15.9)
Hepatitis B Surface Ag: NEGATIVE
Immature Grans (Abs): 0 10*3/uL (ref 0.0–0.1)
Immature Granulocytes: 0 %
Lymphocytes Absolute: 1.4 10*3/uL (ref 0.7–3.1)
Lymphs: 30 %
MCH: 28.4 pg (ref 26.6–33.0)
MCHC: 33 g/dL (ref 31.5–35.7)
MCV: 86 fL (ref 79–97)
Monocytes Absolute: 0.3 10*3/uL (ref 0.1–0.9)
Monocytes: 6 %
Neutrophils Absolute: 2.8 10*3/uL (ref 1.4–7.0)
Neutrophils: 63 %
Platelets: 195 10*3/uL (ref 150–450)
RBC: 4.02 x10E6/uL (ref 3.77–5.28)
RDW: 15 % (ref 11.7–15.4)
RPR Ser Ql: NONREACTIVE
Rh Factor: POSITIVE
Rubella Antibodies, IGG: 1.09 {index} (ref >0.99)
WBC: 4.5 10*3/uL (ref 3.4–10.8)

## 2023-11-23 LAB — HEMOGLOBIN A1C
Est. average glucose Bld gHb Est-mCnc: 103 mg/dL
Hgb A1c MFr Bld: 5.2 % (ref 4.8–5.6)

## 2023-11-23 LAB — HCV INTERPRETATION

## 2023-11-27 LAB — PANORAMA PRENATAL TEST FULL PANEL:PANORAMA TEST PLUS 5 ADDITIONAL MICRODELETIONS: FETAL FRACTION: 9.2

## 2023-12-18 ENCOUNTER — Ambulatory Visit: Admitting: Advanced Practice Midwife

## 2023-12-18 ENCOUNTER — Encounter: Payer: Self-pay | Admitting: Advanced Practice Midwife

## 2023-12-18 VITALS — BP 100/64 | HR 76 | Wt 146.0 lb

## 2023-12-18 DIAGNOSIS — Z3A16 16 weeks gestation of pregnancy: Secondary | ICD-10-CM

## 2023-12-18 DIAGNOSIS — Z1379 Encounter for other screening for genetic and chromosomal anomalies: Secondary | ICD-10-CM

## 2023-12-18 DIAGNOSIS — Z3402 Encounter for supervision of normal first pregnancy, second trimester: Secondary | ICD-10-CM

## 2023-12-18 DIAGNOSIS — Z363 Encounter for antenatal screening for malformations: Secondary | ICD-10-CM

## 2023-12-18 DIAGNOSIS — Z3401 Encounter for supervision of normal first pregnancy, first trimester: Secondary | ICD-10-CM

## 2023-12-18 NOTE — Patient Instructions (Signed)
 Kelly Ibarra, thank you for choosing our office today! We appreciate the opportunity to meet your healthcare needs. You may receive a short survey by mail, e-mail, or through [employer]. If you are happy with your care we would appreciate if you could take just a few minutes to complete the survey questions. We read all of your comments and take your feedback very seriously. Thank you again for choosing our office.  Center for [employer] Team at [facility] at [facility] ([address] [place], [place] [address]) Entrance C, located off of [address] Free 24/7 valet parking  Go to [email or web address] to register for FREE online childbirth classes  Call the office [phone]) or go to [facility] if: You begin to severe cramping Your water breaks.  Sometimes it is a big gush of fluid, sometimes it is just a trickle that keeps getting your panties wet or running down your legs You have vaginal bleeding.  It is normal to have a small amount of spotting if your cervix was checked.   [facility] Pediatricians/Family Doctors [facility]): [address] Meg Spina, [phone]           [facility]: [address], [phone]                [facility]): [address], [phone] (call to ask if accepting patients) [facility] Department: [address], [place], [phone]    [facility] Pediatricians/Family Doctors [facility]): [phone] [address], [phone] [facility]: [address] [place], [phone] [facility]: [address], [phone]  [facility] Doctors  [facility]): [phone] [facility]: [address] [phone]   [facility] Doctors [facility]: [address] [phone]  [facility] Doctors  [facility]: [phone], [phone]  Home Blood Pressure Monitoring for Patients   Your provider has recommended that you check your blood pressure (BP) at least once a week at home. If you do not have a blood pressure cuff at home, one will be provided for you. Contact your provider if you have not received your monitor within 1 week.   Helpful Tips for Accurate Home Blood Pressure Checks  Don't smoke, exercise, or drink caffeine 30 minutes before checking your BP Use the restroom before checking your BP (a full bladder can raise your pressure) Relax in a comfortable upright chair Feet on the ground Left arm resting comfortably on a flat surface at the level of your heart Legs uncrossed Back supported Sit quietly and don't talk Place the cuff on your bare arm Adjust snuggly, so that only two fingertips can fit between your skin and the top of the cuff Check 2 readings separated by at least one minute Keep a log of your BP readings For a visual, please reference this diagram: http://ccnc.care/bpdiagram  Provider Name: [facility]     Phone: [phone]  Zone 1: ALL CLEAR  Continue to monitor your symptoms:  BP reading is less than 140 (top number) or less than 90 (bottom number)  No right upper stomach pain No headaches or seeing spots No feeling nauseated or throwing up No swelling in face and hands  Zone 2: CAUTION Call your doctor's office for any of the following:  BP reading is greater than 140 (top number) or greater than  90 (bottom number)  Stomach pain under your ribs in the middle or right side Headaches or seeing spots Feeling nauseated or throwing up Swelling in face and hands  Zone 3: EMERGENCY  Seek immediate medical care if you have any of the following:  BP reading is greater than160 (top number) or greater than 110 (bottom number) Severe headaches not improving with Tylenol  Serious difficulty catching your breath Any worsening symptoms from  Zone 2     Second Trimester of Pregnancy The second trimester is from week 14 through week 27 (months 4 through 6). The second trimester is often a time when you feel your best. Your body has adjusted to being pregnant, and you begin to feel better physically. Usually, morning sickness has lessened or quit completely, you may have more energy, and you may have an increase in appetite. The second trimester is also a time when the fetus is growing rapidly. At the end of the sixth month, the fetus is about 9 inches long and weighs about 1 pounds. You will likely begin to feel the baby move (quickening) between 16 and 20 weeks of pregnancy. Body changes during your second trimester Your body continues to go through many changes during your second trimester. The changes vary from woman to woman. Your weight will continue to increase. You will notice your lower abdomen bulging out. You may begin to get stretch marks on your hips, abdomen, and breasts. You may develop headaches that can be relieved by medicines. The medicines should be approved by your health care provider. You may urinate more often because the fetus is pressing on your bladder. You may develop or continue to have heartburn as a result of your pregnancy. You may develop constipation because certain hormones are causing the muscles that push waste through your intestines to slow down. You may develop hemorrhoids or swollen, bulging veins (varicose veins). You may have back pain. This is caused by: Weight gain. Pregnancy hormones that are relaxing the joints in your pelvis. A shift in weight and the muscles that support your balance. Your breasts will continue to grow and they will continue to become tender. Your gums may bleed and may be sensitive to brushing and flossing. Dark spots or blotches (chloasma, mask of pregnancy) may develop on your face. This will likely fade after the baby is born. A dark line from your belly button to  the pubic area (linea nigra) may appear. This will likely fade after the baby is born. You may have changes in your hair. These can include thickening of your hair, rapid growth, and changes in texture. Some women also have hair loss during or after pregnancy, or hair that feels dry or thin. Your hair will most likely return to normal after your baby is born.  What to expect at prenatal visits During a routine prenatal visit: You will be weighed to make sure you and the fetus are growing normally. Your blood pressure will be taken. Your abdomen will be measured to track your baby's growth. The fetal heartbeat will be listened to. Any test results from the previous visit will be discussed.  Your health care provider may ask you: How you are feeling. If you are feeling the baby move. If you have had any abnormal symptoms, such as leaking fluid, bleeding, severe headaches, or abdominal cramping. If you are using any tobacco products, including cigarettes, chewing tobacco, and electronic cigarettes. If you have any questions.  Other tests that may be performed during  your second trimester include: Blood tests that check for: Low iron levels (anemia). High blood sugar that affects pregnant women (gestational diabetes) between 68 and 28 weeks. Rh antibodies. This is to check for a protein on red blood cells (Rh factor). Urine tests to check for infections, diabetes, or protein in the urine. An ultrasound to confirm the proper growth and development of the baby. An amniocentesis to check for possible genetic problems. Fetal screens for spina bifida and Down syndrome. HIV (human immunodeficiency virus) testing. Routine prenatal testing includes screening for HIV, unless you choose not to have this test.  Follow these instructions at home: Medicines Follow your health care provider's instructions regarding medicine use. Specific medicines may be either safe or unsafe to take during  pregnancy. Take a prenatal vitamin that contains at least 600 micrograms (mcg) of folic acid. If you develop constipation, try taking a stool softener if your health care provider approves. Eating and drinking Eat a balanced diet that includes fresh fruits and vegetables, whole grains, good sources of protein such as meat, eggs, or tofu, and low-fat dairy. Your health care provider will help you determine the amount of weight gain that is right for you. Avoid raw meat and uncooked cheese. These carry germs that can cause birth defects in the baby. If you have low calcium intake from food, talk to your health care provider about whether you should take a daily calcium supplement. Limit foods that are high in fat and processed sugars, such as fried and sweet foods. To prevent constipation: Drink enough fluid to keep your urine clear or pale yellow. Eat foods that are high in fiber, such as fresh fruits and vegetables, whole grains, and beans. Activity Exercise only as directed by your health care provider. Most women can continue their usual exercise routine during pregnancy. Try to exercise for 30 minutes at least 5 days a week. Stop exercising if you experience uterine contractions. Avoid heavy lifting, wear low heel shoes, and practice good posture. A sexual relationship may be continued unless your health care provider directs you otherwise. Relieving pain and discomfort Wear a good support bra to prevent discomfort from breast tenderness. Take warm sitz baths to soothe any pain or discomfort caused by hemorrhoids. Use hemorrhoid cream if your health care provider approves. Rest with your legs elevated if you have leg cramps or low back pain. If you develop varicose veins, wear support hose. Elevate your feet for 15 minutes, 3-4 times a day. Limit salt in your diet. Prenatal Care Write down your questions. Take them to your prenatal visits. Keep all your prenatal visits as told by your health  care provider. This is important. Safety Wear your seat belt at all times when driving. Make a list of emergency phone numbers, including numbers for family, friends, the hospital, and police and fire departments. General instructions Ask your health care provider for a referral to a local prenatal education class. Begin classes no later than the beginning of month 6 of your pregnancy. Ask for help if you have counseling or nutritional needs during pregnancy. Your health care provider can offer advice or refer you to specialists for help with various needs. Do not use hot tubs, steam rooms, or saunas. Do not douche or use tampons or scented sanitary pads. Do not cross your legs for long periods of time. Avoid cat litter boxes and soil used by cats. These carry germs that can cause birth defects in the baby and possibly loss of the  fetus by miscarriage or stillbirth. Avoid all smoking, herbs, alcohol, and unprescribed drugs. Chemicals in these products can affect the formation and growth of the baby. Do not use any products that contain nicotine or tobacco, such as cigarettes and e-cigarettes. If you need help quitting, ask your health care provider. Visit your dentist if you have not gone yet during your pregnancy. Use a soft toothbrush to brush your teeth and be gentle when you floss. Contact a health care provider if: You have dizziness. You have mild pelvic cramps, pelvic pressure, or nagging pain in the abdominal area. You have persistent nausea, vomiting, or diarrhea. You have a bad smelling vaginal discharge. You have pain when you urinate. Get help right away if: You have a fever. You are leaking fluid from your vagina. You have spotting or bleeding from your vagina. You have severe abdominal cramping or pain. You have rapid weight gain or weight loss. You have shortness of breath with chest pain. You notice sudden or extreme swelling of your face, hands, ankles, feet, or legs. You  have not felt your baby move in over an hour. You have severe headaches that do not go away when you take medicine. You have vision changes. Summary The second trimester is from week 14 through week 27 (months 4 through 6). It is also a time when the fetus is growing rapidly. Your body goes through many changes during pregnancy. The changes vary from woman to woman. Avoid all smoking, herbs, alcohol, and unprescribed drugs. These chemicals affect the formation and growth your baby. Do not use any tobacco products, such as cigarettes, chewing tobacco, and e-cigarettes. If you need help quitting, ask your health care provider. Contact your health care provider if you have any questions. Keep all prenatal visits as told by your health care provider. This is important. This information is not intended to replace advice given to you by your health care provider. Make sure you discuss any questions you have with your health care provider. Document Released: 07/17/2001 Document Revised: 12/29/2015 Document Reviewed: 09/23/2012 Elsevier Interactive Patient Education  2017 [employer].

## 2023-12-18 NOTE — Progress Notes (Signed)
   LOW-RISK PREGNANCY VISIT Patient name: Kelly Ibarra MRN [id number]  Date of birth: Apr 11, 2006 Chief Complaint:   Routine Prenatal Visit (2nd IT)  History of Present Illness:   Kelly Ibarra is a 18 y.o. G1P0 female at [redacted]w[redacted]d with an Estimated Date of Delivery: 06/02/24 being seen today for ongoing management of a low-risk pregnancy.  Today she reports no complaints. Contractions: Not present.  .  Movement: Absent. denies leaking of fluid. Review of Systems:   Pertinent items are noted in HPI Denies abnormal vaginal discharge w/ itching/odor/irritation, headaches, visual changes, shortness of breath, chest pain, abdominal pain, severe nausea/vomiting, or problems with urination or bowel movements unless otherwise stated above. Pertinent History Reviewed:  Reviewed past medical,surgical, social, obstetrical and family history.  Reviewed problem list, medications and allergies. Physical Assessment:   Vitals:   12/18/23 0855  BP: 100/64  Pulse: 76  Weight: 146 lb (66.2 kg)  Body mass index is 23.57 kg/m.        Physical Examination:   General appearance: Well appearing, and in no distress  Mental status: Alert, oriented to person, place, and time  Skin: Warm & dry  Cardiovascular: Normal heart rate noted  Respiratory: Normal respiratory effort, no distress  Abdomen: Soft, gravid, nontender  Pelvic: Cervical exam deferred         Extremities: Edema: Trace  Fetal Status: Fetal Heart Rate (bpm): 146   Movement: Absent    No results found for this or any previous visit (from the past 24 hours).  Assessment & Plan:  1) Low-risk pregnancy G1P0 at [redacted]w[redacted]d with an Estimated Date of Delivery: 06/02/24   2) Asthma/allergies, has albuterol  prn   Meds: No orders of the defined types were placed in this encounter.  Labs/procedures today: 2nd IT  Plan:  Continue routine obstetrical care   Reviewed: Preterm labor symptoms and general obstetric precautions including but not limited to  vaginal bleeding, contractions, leaking of fluid and fetal movement were reviewed in detail with the patient.  All questions were answered. Has home bp cuff. Check bp weekly, let us  know if >140/90.   Follow-up: Return in about 4 weeks (around 01/15/2024) for LROB, US : Anatomy.  Orders Placed This Encounter  Procedures   US  OB Comp + 14 Wk   INTEGRATED 2   Jolayne Natter [facility] 12/18/2023 9:22 AM

## 2023-12-21 ENCOUNTER — Ambulatory Visit: Payer: Self-pay | Admitting: Advanced Practice Midwife

## 2023-12-21 LAB — INTEGRATED 2
AFP MoM: 0.8
Alpha-Fetoprotein: 33.4 ng/mL
Crown Rump Length: 67.4 mm
DIA MoM: 0.84
DIA Value: 141.4 pg/mL
Estriol, Unconjugated: 1.84 ng/mL
Gest. Age on Collection Date: 12.9 wk
Gestational Age: 17 wk
Maternal Age at EDD: 20.2 a
Nuchal Translucency (NT): 1.9 mm
Nuchal Translucency MoM: 1.05
Number of Fetuses: 1
PAPP-A MoM: 1.37
PAPP-A Value: 1416 ng/mL
Sonographer ID#: 309760
Test Results:: NEGATIVE
Weight: 136 [lb_av]
Weight: 136 [lb_av]
hCG MoM: 0.89
hCG Value: 28.7 [IU]/mL
uE3 MoM: 1.41

## 2024-01-03 ENCOUNTER — Other Ambulatory Visit (INDEPENDENT_AMBULATORY_CARE_PROVIDER_SITE_OTHER): Payer: Self-pay | Admitting: Nurse Practitioner

## 2024-01-03 ENCOUNTER — Other Ambulatory Visit: Payer: Self-pay | Admitting: Adult Health

## 2024-01-15 ENCOUNTER — Ambulatory Visit: Admitting: Radiology

## 2024-01-15 ENCOUNTER — Encounter: Payer: Self-pay | Admitting: Advanced Practice Midwife

## 2024-01-15 ENCOUNTER — Ambulatory Visit: Admitting: Advanced Practice Midwife

## 2024-01-15 VITALS — BP 114/77 | HR 82 | Wt 157.0 lb

## 2024-01-15 DIAGNOSIS — Z3A20 20 weeks gestation of pregnancy: Secondary | ICD-10-CM | POA: Diagnosis not present

## 2024-01-15 DIAGNOSIS — Z3402 Encounter for supervision of normal first pregnancy, second trimester: Secondary | ICD-10-CM

## 2024-01-15 DIAGNOSIS — Z363 Encounter for antenatal screening for malformations: Secondary | ICD-10-CM

## 2024-01-15 NOTE — Progress Notes (Signed)
   LOW-RISK PREGNANCY VISIT Patient name: Kelly Ibarra MRN [id number]  Date of birth: [birth year] Chief Complaint:   Routine Prenatal Visit (Anatomy scan/)  History of Present Illness:   Kelly Ibarra is a 18 y.o. G1P0 female at [redacted]w[redacted]d with an Estimated Date of Delivery: 06/02/24 being seen today for ongoing management of a low-risk pregnancy.  Today she reports no complaints. Contractions: Not present. Vag. Bleeding: None.  Movement: Present. denies leaking of fluid. Review of Systems:   Pertinent items are noted in HPI Denies abnormal vaginal discharge w/ itching/odor/irritation, headaches, visual changes, shortness of breath, chest pain, abdominal pain, severe nausea/vomiting, or problems with urination or bowel movements unless otherwise stated above. Pertinent History Reviewed:  Reviewed past medical,surgical, social, obstetrical and family history.  Reviewed problem list, medications and allergies. Physical Assessment:   Vitals:   01/15/24 0922  BP: 114/77  Pulse: 82  Weight: 157 lb (71.2 kg)  Body mass index is 25.34 kg/m.        Physical Examination:   General appearance: Well appearing, and in no distress  Mental status: Alert, oriented to person, place, and time  Skin: Warm & dry  Cardiovascular: Normal heart rate noted  Respiratory: Normal respiratory effort, no distress  Abdomen: Soft, gravid, nontender  Pelvic: Cervical exam deferred         Extremities: Edema: None  Fetal Status: Fetal Heart Rate (bpm): 144 u/s   Movement: Present    Anatomy u/s: US : GA = 20+1 weeks Single active female fetus, breech, FHR = 144 bpm, fundal posterior pl, gr0, MVP = 4.7 cm, EFW 93%, 408g, nl ov's, CL = 4.1 cm, closed    Anatomy screen complete - no apparent abnormalities  No results found for this or any previous visit (from the past 24 hours).  Assessment & Plan:  1) Low-risk pregnancy G1P0 at [redacted]w[redacted]d with an Estimated Date of Delivery: 06/02/24     Meds: No orders of the defined  types were placed in this encounter.  Labs/procedures today: anatomy u/s  Plan:  Continue routine obstetrical care   Reviewed: Preterm labor symptoms and general obstetric precautions including but not limited to vaginal bleeding, contractions, leaking of fluid and fetal movement were reviewed in detail with the patient.  All questions were answered. Has home bp cuff. Check bp weekly, let us  know if >140/90.   Follow-up: Return in about 4 weeks (around 02/12/2024) for LROB, in person.  No orders of the defined types were placed in this encounter.  Jolayne Natter CNM 01/15/2024 9:41 AM

## 2024-01-15 NOTE — Progress Notes (Signed)
 US : GA = 20+1 weeks Single active female fetus, breech, FHR = 144 bpm, fundal posterior pl, gr0, EFW 93%, 408g, nl ov's, CL = 4.1 cm, closed    Anatomy screen complete - no apparent abnormalities

## 2024-01-15 NOTE — Patient Instructions (Signed)
 Kelly Ibarra, thank you for choosing our office today! We appreciate the opportunity to meet your healthcare needs. You may receive a short survey by mail, e-mail, or through [employer]. If you are happy with your care we would appreciate if you could take just a few minutes to complete the survey questions. We read all of your comments and take your feedback very seriously. Thank you again for choosing our office.  Center for [employer] Team at [facility] at [facility] ([address] [place], [place] [address]) Entrance C, located off of [address] Free 24/7 valet parking  Go to [email or web address] to register for FREE online childbirth classes  Call the office [phone]) or go to [facility] if: You begin to severe cramping Your water breaks.  Sometimes it is a big gush of fluid, sometimes it is just a trickle that keeps getting your panties wet or running down your legs You have vaginal bleeding.  It is normal to have a small amount of spotting if your cervix was checked.   [facility] Pediatricians/Family Doctors [facility]): [address] Meg Spina, [phone]           [facility]: [address], [phone]                [facility]): [address], [phone] (call to ask if accepting patients) [facility] Department: [address], [place], [phone]    [facility] Pediatricians/Family Doctors [facility]): [phone] [address], [phone] [facility]: [address] [place], [phone] [facility]: [address], [phone]  [facility] Doctors  [facility]): [phone] [facility]: [address] [phone]   [facility] Doctors [facility]: [address] [phone]  [facility] Doctors  [facility]: [phone], [phone]  Home Blood Pressure Monitoring for Patients   Your provider has recommended that you check your blood pressure (BP) at least once a week at home. If you do not have a blood pressure cuff at home, one will be provided for you. Contact your provider if you have not received your monitor within 1 week.   Helpful Tips for Accurate Home Blood Pressure Checks  Don't smoke, exercise, or drink caffeine 30 minutes before checking your BP Use the restroom before checking your BP (a full bladder can raise your pressure) Relax in a comfortable upright chair Feet on the ground Left arm resting comfortably on a flat surface at the level of your heart Legs uncrossed Back supported Sit quietly and don't talk Place the cuff on your bare arm Adjust snuggly, so that only two fingertips can fit between your skin and the top of the cuff Check 2 readings separated by at least one minute Keep a log of your BP readings For a visual, please reference this diagram: http://ccnc.care/bpdiagram  Provider Name: [facility]     Phone: [phone]  Zone 1: ALL CLEAR  Continue to monitor your symptoms:  BP reading is less than 140 (top number) or less than 90 (bottom number)  No right upper stomach pain No headaches or seeing spots No feeling nauseated or throwing up No swelling in face and hands  Zone 2: CAUTION Call your doctor's office for any of the following:  BP reading is greater than 140 (top number) or greater than  90 (bottom number)  Stomach pain under your ribs in the middle or right side Headaches or seeing spots Feeling nauseated or throwing up Swelling in face and hands  Zone 3: EMERGENCY  Seek immediate medical care if you have any of the following:  BP reading is greater than160 (top number) or greater than 110 (bottom number) Severe headaches not improving with Tylenol  Serious difficulty catching your breath Any worsening symptoms from  Zone 2     Second Trimester of Pregnancy The second trimester is from week 14 through week 27 (months 4 through 6). The second trimester is often a time when you feel your best. Your body has adjusted to being pregnant, and you begin to feel better physically. Usually, morning sickness has lessened or quit completely, you may have more energy, and you may have an increase in appetite. The second trimester is also a time when the fetus is growing rapidly. At the end of the sixth month, the fetus is about 9 inches long and weighs about 1 pounds. You will likely begin to feel the baby move (quickening) between 16 and 20 weeks of pregnancy. Body changes during your second trimester Your body continues to go through many changes during your second trimester. The changes vary from woman to woman. Your weight will continue to increase. You will notice your lower abdomen bulging out. You may begin to get stretch marks on your hips, abdomen, and breasts. You may develop headaches that can be relieved by medicines. The medicines should be approved by your health care provider. You may urinate more often because the fetus is pressing on your bladder. You may develop or continue to have heartburn as a result of your pregnancy. You may develop constipation because certain hormones are causing the muscles that push waste through your intestines to slow down. You may develop hemorrhoids or swollen, bulging veins (varicose veins). You may have back pain. This is caused by: Weight gain. Pregnancy hormones that are relaxing the joints in your pelvis. A shift in weight and the muscles that support your balance. Your breasts will continue to grow and they will continue to become tender. Your gums may bleed and may be sensitive to brushing and flossing. Dark spots or blotches (chloasma, mask of pregnancy) may develop on your face. This will likely fade after the baby is born. A dark line from your belly button to  the pubic area (linea nigra) may appear. This will likely fade after the baby is born. You may have changes in your hair. These can include thickening of your hair, rapid growth, and changes in texture. Some women also have hair loss during or after pregnancy, or hair that feels dry or thin. Your hair will most likely return to normal after your baby is born.  What to expect at prenatal visits During a routine prenatal visit: You will be weighed to make sure you and the fetus are growing normally. Your blood pressure will be taken. Your abdomen will be measured to track your baby's growth. The fetal heartbeat will be listened to. Any test results from the previous visit will be discussed.  Your health care provider may ask you: How you are feeling. If you are feeling the baby move. If you have had any abnormal symptoms, such as leaking fluid, bleeding, severe headaches, or abdominal cramping. If you are using any tobacco products, including cigarettes, chewing tobacco, and electronic cigarettes. If you have any questions.  Other tests that may be performed during  your second trimester include: Blood tests that check for: Low iron levels (anemia). High blood sugar that affects pregnant women (gestational diabetes) between 68 and 28 weeks. Rh antibodies. This is to check for a protein on red blood cells (Rh factor). Urine tests to check for infections, diabetes, or protein in the urine. An ultrasound to confirm the proper growth and development of the baby. An amniocentesis to check for possible genetic problems. Fetal screens for spina bifida and Down syndrome. HIV (human immunodeficiency virus) testing. Routine prenatal testing includes screening for HIV, unless you choose not to have this test.  Follow these instructions at home: Medicines Follow your health care provider's instructions regarding medicine use. Specific medicines may be either safe or unsafe to take during  pregnancy. Take a prenatal vitamin that contains at least 600 micrograms (mcg) of folic acid. If you develop constipation, try taking a stool softener if your health care provider approves. Eating and drinking Eat a balanced diet that includes fresh fruits and vegetables, whole grains, good sources of protein such as meat, eggs, or tofu, and low-fat dairy. Your health care provider will help you determine the amount of weight gain that is right for you. Avoid raw meat and uncooked cheese. These carry germs that can cause birth defects in the baby. If you have low calcium intake from food, talk to your health care provider about whether you should take a daily calcium supplement. Limit foods that are high in fat and processed sugars, such as fried and sweet foods. To prevent constipation: Drink enough fluid to keep your urine clear or pale yellow. Eat foods that are high in fiber, such as fresh fruits and vegetables, whole grains, and beans. Activity Exercise only as directed by your health care provider. Most women can continue their usual exercise routine during pregnancy. Try to exercise for 30 minutes at least 5 days a week. Stop exercising if you experience uterine contractions. Avoid heavy lifting, wear low heel shoes, and practice good posture. A sexual relationship may be continued unless your health care provider directs you otherwise. Relieving pain and discomfort Wear a good support bra to prevent discomfort from breast tenderness. Take warm sitz baths to soothe any pain or discomfort caused by hemorrhoids. Use hemorrhoid cream if your health care provider approves. Rest with your legs elevated if you have leg cramps or low back pain. If you develop varicose veins, wear support hose. Elevate your feet for 15 minutes, 3-4 times a day. Limit salt in your diet. Prenatal Care Write down your questions. Take them to your prenatal visits. Keep all your prenatal visits as told by your health  care provider. This is important. Safety Wear your seat belt at all times when driving. Make a list of emergency phone numbers, including numbers for family, friends, the hospital, and police and fire departments. General instructions Ask your health care provider for a referral to a local prenatal education class. Begin classes no later than the beginning of month 6 of your pregnancy. Ask for help if you have counseling or nutritional needs during pregnancy. Your health care provider can offer advice or refer you to specialists for help with various needs. Do not use hot tubs, steam rooms, or saunas. Do not douche or use tampons or scented sanitary pads. Do not cross your legs for long periods of time. Avoid cat litter boxes and soil used by cats. These carry germs that can cause birth defects in the baby and possibly loss of the  fetus by miscarriage or stillbirth. Avoid all smoking, herbs, alcohol, and unprescribed drugs. Chemicals in these products can affect the formation and growth of the baby. Do not use any products that contain nicotine or tobacco, such as cigarettes and e-cigarettes. If you need help quitting, ask your health care provider. Visit your dentist if you have not gone yet during your pregnancy. Use a soft toothbrush to brush your teeth and be gentle when you floss. Contact a health care provider if: You have dizziness. You have mild pelvic cramps, pelvic pressure, or nagging pain in the abdominal area. You have persistent nausea, vomiting, or diarrhea. You have a bad smelling vaginal discharge. You have pain when you urinate. Get help right away if: You have a fever. You are leaking fluid from your vagina. You have spotting or bleeding from your vagina. You have severe abdominal cramping or pain. You have rapid weight gain or weight loss. You have shortness of breath with chest pain. You notice sudden or extreme swelling of your face, hands, ankles, feet, or legs. You  have not felt your baby move in over an hour. You have severe headaches that do not go away when you take medicine. You have vision changes. Summary The second trimester is from week 14 through week 27 (months 4 through 6). It is also a time when the fetus is growing rapidly. Your body goes through many changes during pregnancy. The changes vary from woman to woman. Avoid all smoking, herbs, alcohol, and unprescribed drugs. These chemicals affect the formation and growth your baby. Do not use any tobacco products, such as cigarettes, chewing tobacco, and e-cigarettes. If you need help quitting, ask your health care provider. Contact your health care provider if you have any questions. Keep all prenatal visits as told by your health care provider. This is important. This information is not intended to replace advice given to you by your health care provider. Make sure you discuss any questions you have with your health care provider. Document Released: 07/17/2001 Document Revised: 12/29/2015 Document Reviewed: 09/23/2012 Elsevier Interactive Patient Education  2017 [employer].

## 2024-01-23 NOTE — Progress Notes (Signed)
 [address] [place] [address] Dept: [phone]  FOLLOW UP NOTE  Patient ID: Kelly Ibarra, female    DOB: [birth year]  Age: 18 y.o. MRN: [id number] Date of Office Visit: 01/24/2024  Assessment  Chief Complaint: Establish Care and Follow-up  HPI Kelly Ibarra is a 18 year old female who presents to the clinic for follow-up visit.  She was last seen in this clinic on 10/25/2003 by Dr. Iva for evaluation of pregnancy complicating asthma, allergic rhinitis, urticaria, and atopic dermatitis. She is currently [redacted] weeks pregnant.   At today's visit, she reports her asthma has been moderately well-controlled with occasional shortness of breath mainly occurring with activity.  Otherwise, she denies shortness of breath, cough, or wheeze with rest.  She continues albuterol  about once a week or once every other week with relief of symptoms.  She reports that she does not have Flovent  110 at this time and has not used this medication over the last several months.  Allergic rhinitis is reported as moderately well-controlled with sneezing as the main symptom.  She continues cetirizine  daily and reports that she does not have a nasal steroid spray.  She is not currently using a nasal saline rinse. Her last environmental allergy  testing via lab was on 07/10/2023 and was positive to dust mites, cat, and dog.   She reports that she ate some bacon on some [place] fries after which she developed a case of hives for which she took an antihistamine with relief of symptoms.  She denied concomitant cardiopulmonary or gastrointestinal symptoms with these hives.  Otherwise, she continues to avoid mammalian meat.  She continues to consume dairy products with no adverse reaction.  Her last food allergy  testing via lab was on 10/25/2023 and was slightly positive to milk. Her last alpha gal lab testing was slightly positive on 10/25/2023 with alpha gal IgE 0.22.  We are planning for mammalian meat challenge  after she gives birth.  Atopic dermatitis is reported as well-controlled with no red or itchy areas at this time.  She continues a twice a day moisturizer and has not needed to use any topical medicated treatments over the last several months.  Her current medications are listed in the chart.  Drug Allergies:  Allergies  Allergen Reactions   Alpha-Gal Anaphylaxis    Physical Exam: BP 124/68 (BP Location: Left Arm, Patient Position: Sitting, Cuff Size: Normal)   Pulse 86   Temp 98.2 F (36.8 C) (Temporal)   Resp 19   Ht 5' 4.17 (1.63 m)   Wt 161 lb 3.2 oz (73.1 kg)   LMP 08/27/2023   SpO2 99%   BMI 27.52 kg/m    Physical Exam Vitals reviewed.  Constitutional:      Appearance: Normal appearance.  HENT:     Head: Normocephalic and atraumatic.     Right Ear: Tympanic membrane normal.     Left Ear: Tympanic membrane normal.     Nose:     Comments: Bilateral nares slightly erythematous with thin clear nasal drainage noted.  Pharynx normal.  Ears normal.  Eyes normal.    Mouth/Throat:     Pharynx: Oropharynx is clear.   Eyes:     Conjunctiva/sclera: Conjunctivae normal.    Cardiovascular:     Rate and Rhythm: Normal rate and regular rhythm.     Heart sounds: Normal heart sounds. No murmur heard. Pulmonary:     Effort: Pulmonary effort is normal.     Breath sounds: Normal breath  sounds.     Comments: Clear to auscultation  Musculoskeletal:        General: Normal range of motion.     Cervical back: Normal range of motion.   Skin:    General: Skin is warm and dry.   Neurological:     Mental Status: She is alert and oriented to person, place, and time.   Psychiatric:        Mood and Affect: Mood normal.        Behavior: Behavior normal.        Thought Content: Thought content normal.        Judgment: Judgment normal.     Diagnostics: FVC 2.84 which is 87% predicted value, FEV1 1.95 which is 67% of predicted value.  Spirometry indicates mild airway  obstruction.  Postbronchodilator FVC 2.95, FEV1 2.19.  Postbronchodilator spirometry indicates 12% improvement in FEV1  Assessment and Plan: 1. Not well controlled moderate persistent asthma   2. [redacted] weeks gestation of pregnancy   3. Perennial allergic rhinitis   4. Idiopathic urticaria   5. Intrinsic atopic dermatitis   6. Allergy  to alpha-gal     Meds ordered this encounter  Medications   Budesonide  (PULMICORT  FLEXHALER) 90 MCG/ACT inhaler    Sig: Inhale 2 puffs into the lungs daily.    Dispense:  1 each    Refill:  5   budesonide  (RHINOCORT  AQUA) 32 MCG/ACT nasal spray    Sig: Place 2 sprays into both nostrils daily.    Dispense:  8.43 mL    Refill:  5    Patient Instructions  Asthma Begin Pulmicort  90-2 puffs once a day to prevent cough or wheeze Continue albuterol  2 puffs once every 4 hours as needed for cough or wheeze You may use albuterol  2 puffs 5 to 15 minutes before activity to decrease cough or wheeze Follow-up for asthma flare, increase Pulmicort  90 to 2 puffs twice a day for 2 weeks and call the clinic  Allergic rhinitis Continue allergen avoidance measures directed toward dust mite, cat and dog as listed below Continue cetirizine  10 mg once a day as needed for runny nose or itch Begin Rhinocort  nasal spray 1 spray in each nostril up to 4 times a day if needed for stuffy nose.  In the right nostril, point the applicator out toward the right ear. In the left nostril, point the applicator out toward the left ear Consider saline nasal rinses as needed for nasal symptoms. Use this before any medicated nasal sprays for best result  Urticaria Continue cetirizine  10 mg once a day as needed for hives or itch.  You may take an additional dose of cetirizine  10 mg once a day if needed for breakthrough symptoms If your symptoms re-occur, begin a journal of events that occurred for up to 6 hours before your symptoms began including foods and beverages consumed, soaps or perfumes  you had contact with, and medications.   Atopic dermatitis Continue a twice a day moisturizer Continue cetirizine  10 mg once a day if needed for itch  Alpha gal allergy  Continue to avoid mammalian meat.  In case of an allergic reaction, take cetirizine  10 mg once every 24 hours Or Benadryl 50 mg every 4 hours, and if life-threatening symptoms occur, inject with EpiPen 0.3 mg. Let's plan on a mammal meat challenge after you have the baby. Remember to stop cetirizine  for 3 days before the challenge.  Okay  Follow up in 3 months or sooner if needed.  Return in about 4 weeks (around 02/21/2024).    Thank you for the opportunity to care for this patient.  Please do not hesitate to contact me with questions.  Arlean Mutter, FNP [facility] 

## 2024-01-23 NOTE — Patient Instructions (Incomplete)
 Asthma Continue Flovent  110-2 puffs twice a day with a spacer to prevent cough or wheeze Continue albuterol  2 puffs once every 4 hours as needed for cough or wheeze You may use albuterol  2 puffs 5 to 15 minutes before activity to decrease cough or wheeze  Allergic rhinitis Continue allergen avoidance measures directed toward dust mite, cat and dog as listed below Continue cetirizine  10 mg once a day as needed for runny nose or itch Consider saline nasal rinses as needed for nasal symptoms. Use this before any medicated nasal sprays for best result  Urticaria Continue cetirizine  10 mg once a day as needed for hives or itch.  You may take an additional dose of cetirizine  10 mg once a day if needed for breakthrough symptoms If your symptoms re-occur, begin a journal of events that occurred for up to 6 hours before your symptoms began including foods and beverages consumed, soaps or perfumes you had contact with, and medications.   Atopic dermatitis Continue a twice a day moisturizer Continue cetirizine  10 mg once a day if needed for itch  Alpha gal allergy  Continue to avoid mammalian meat.  In case of an allergic reaction, take cetirizine  10 mg once every 24 hours Or Benadryl 50 mg every 4 hours, and if life-threatening symptoms occur, inject with EpiPen 0.3 mg.  Follow up in 3 months or sooner if needed.

## 2024-01-24 ENCOUNTER — Other Ambulatory Visit: Payer: Self-pay

## 2024-01-24 ENCOUNTER — Ambulatory Visit (INDEPENDENT_AMBULATORY_CARE_PROVIDER_SITE_OTHER): Admitting: Family Medicine

## 2024-01-24 ENCOUNTER — Encounter: Payer: Self-pay | Admitting: Family Medicine

## 2024-01-24 VITALS — BP 124/68 | HR 86 | Temp 98.2°F | Resp 19 | Ht 64.17 in | Wt 161.2 lb

## 2024-01-24 DIAGNOSIS — L501 Idiopathic urticaria: Secondary | ICD-10-CM | POA: Diagnosis not present

## 2024-01-24 DIAGNOSIS — J4540 Moderate persistent asthma, uncomplicated: Secondary | ICD-10-CM | POA: Insufficient documentation

## 2024-01-24 DIAGNOSIS — L2084 Intrinsic (allergic) eczema: Secondary | ICD-10-CM

## 2024-01-24 DIAGNOSIS — Z3A21 21 weeks gestation of pregnancy: Secondary | ICD-10-CM | POA: Insufficient documentation

## 2024-01-24 DIAGNOSIS — J3089 Other allergic rhinitis: Secondary | ICD-10-CM | POA: Diagnosis not present

## 2024-01-24 DIAGNOSIS — Z91018 Allergy to other foods: Secondary | ICD-10-CM

## 2024-01-24 MED ORDER — PULMICORT FLEXHALER 90 MCG/ACT IN AEPB: 2.0000 | INHALATION_SPRAY | Freq: Every day | RESPIRATORY_TRACT | 5 refills | Status: AC

## 2024-01-24 MED ORDER — BUDESONIDE 32 MCG/ACT NA SUSP: 2.0000 | Freq: Every day | NASAL | 5 refills | Status: AC

## 2024-01-28 ENCOUNTER — Other Ambulatory Visit: Payer: Self-pay | Admitting: Adult Health

## 2024-02-12 ENCOUNTER — Ambulatory Visit: Admitting: Advanced Practice Midwife

## 2024-02-12 VITALS — BP 112/63 | HR 84 | Wt 165.0 lb

## 2024-02-12 DIAGNOSIS — Z3402 Encounter for supervision of normal first pregnancy, second trimester: Secondary | ICD-10-CM | POA: Diagnosis not present

## 2024-02-12 DIAGNOSIS — Z3A24 24 weeks gestation of pregnancy: Secondary | ICD-10-CM | POA: Diagnosis not present

## 2024-02-12 NOTE — Progress Notes (Signed)
   LOW-RISK PREGNANCY VISIT Patient name: Kelly Ibarra MRN [id number]  Date of birth: Mar 03, 2006 Chief Complaint:   Routine Prenatal Visit  History of Present Illness:   Kelly Ibarra is a 18 y.o. G1P0 female at [redacted]w[redacted]d with an Estimated Date of Delivery: 06/02/24 being seen today for ongoing management of a low-risk pregnancy.  Today she reports feeling the baby kicking in different locations. Contractions: Not present. Vag. Bleeding: None.  Movement: Present. denies leaking of fluid. Review of Systems:   Pertinent items are noted in HPI Denies abnormal vaginal discharge w/ itching/odor/irritation, headaches, visual changes, shortness of breath, chest pain, abdominal pain, severe nausea/vomiting, or problems with urination or bowel movements unless otherwise stated above. Pertinent History Reviewed:  Reviewed past medical,surgical, social, obstetrical and family history.  Reviewed problem list, medications and allergies. Physical Assessment:   Vitals:   02/12/24 0910  BP: 112/63  Pulse: 84  Weight: 165 lb (74.8 kg)  Body mass index is 28.17 kg/m.        Physical Examination:   General appearance: Well appearing, and in no distress  Mental status: Alert, oriented to person, place, and time  Skin: Warm & dry  Cardiovascular: Normal heart rate noted  Respiratory: Normal respiratory effort, no distress  Abdomen: Soft, gravid, nontender  Pelvic: Cervical exam deferred         Extremities:    Fetal Status: Fetal Heart Rate (bpm): 143 Fundal Height: 24 cm Movement: Present    No results found for this or any previous visit (from the past 24 hours).  Assessment & Plan:  1) Low-risk pregnancy G1P0 at [redacted]w[redacted]d with an Estimated Date of Delivery: 06/02/24   2) Asthma/allergies, [facility]; symptoms well-managed on prn albuterol    Meds: No orders of the defined types were placed in this encounter.  Labs/procedures today: none  Plan:  Continue routine obstetrical care    Reviewed: Preterm labor symptoms and general obstetric precautions including but not limited to vaginal bleeding, contractions, leaking of fluid and fetal movement were reviewed in detail with the patient.  All questions were answered. Has home bp cuff. Check bp weekly, let us  know if >140/90.   Follow-up: Return in about 4 weeks (around 03/11/2024) for LROB, PN2.  No orders of the defined types were placed in this encounter.  Suzen JONETTA Gentry CNM 02/12/2024 9:37 AM

## 2024-02-12 NOTE — Patient Instructions (Signed)
 Kelly Ibarra, I greatly value your feedback.  If you receive a survey following your visit with us  today, we appreciate you taking the time to fill it out.  Thanks, Luke Gentry, CNM   You will have your sugar test next visit.  Please do not eat or drink anything after midnight the night before you come, not even water.  You will be here for at least two hours.  Please make an appointment online for the bloodwork at [email or web address] for 8:30am (or as close to this as possible). Make sure you select the [facility] service center. The day of the appointment, check in with our office first, then you will go to [facility] to start the sugar test.    [facility] HAS MOVED!!! It is now [facility] ([address] [place], [place] [address]) Entrance C, located off of [address] valet parking  Go to [email or web address] to register for FREE online childbirth classes   Call the office [phone]) or go to [facility] if: You begin to have strong, frequent contractions Your water breaks.  Sometimes it is a big gush of fluid, sometimes it is just a trickle that keeps getting your panties wet or running down your legs You have vaginal bleeding.  It is normal to have a small amount of spotting if your cervix was checked.  You don't feel your baby moving like normal.  If you don't, get you something to eat and drink and lay down and focus on feeling your baby move.   If your baby is still not moving like normal, you should call the office or go to [facility].  [place] Pediatricians/Family Doctors: [facility] [phone]           [facility] [phone]                [facility] [phone] (usually not accepting new patients unless you have family there already, you are always welcome to call and ask)      [facility] Department [phone]       [facility] Pediatricians/Family Doctors:  [facility]: [phone] [facility]: [phone] [facility]: [phone]  [facility] Doctors:  [facility]: [phone]  [facility]: [phone]  [facility] Doctors: [facility]: [phone]   Home Blood Pressure Monitoring for Patients   Your provider has recommended that you check your blood pressure (BP) at least once a week at home. If you do not have a blood pressure cuff at home, one will be provided for you. Contact your provider if you have not received your monitor within 1 week.   Helpful Tips for Accurate Home Blood Pressure Checks  Don't smoke, exercise, or drink caffeine 30 minutes before checking your BP Use the restroom before checking your BP (a full bladder can raise your pressure) Relax in a comfortable upright chair Feet on the ground Left arm resting comfortably on a flat surface at the level of your heart Legs uncrossed Back supported Sit quietly and don't talk Place the cuff on your bare arm Adjust snuggly, so that only two fingertips can fit between your skin and the top of the cuff Check 2 readings separated by at least one minute Keep a log of your BP readings For a visual, please reference this diagram: http://ccnc.care/bpdiagram  Provider Name: [facility]     Phone: [phone]  Zone 1: ALL CLEAR  Continue to monitor your symptoms:  BP reading is less than 140 (top number) or less than 90 (bottom number)  No right upper stomach pain No headaches or seeing spots No feeling nauseated or throwing up No swelling in face and hands  Zone 2: CAUTION Call your doctor's office for any of the following:  BP reading is greater than 140 (top number) or greater than 90 (bottom number)  Stomach pain under your ribs in the middle or right side Headaches or seeing spots Feeling nauseated or throwing up Swelling in face and hands  Zone 3: EMERGENCY  Seek  immediate medical care if you have any of the following:  BP reading is greater than160 (top number) or greater than 110 (bottom number) Severe headaches not improving with Tylenol  Serious difficulty catching your breath Any worsening symptoms from Zone 2   Second Trimester of Pregnancy The second trimester is from week 13 through week 28, months 4 through 6. The second trimester is often a time when you feel your best. Your body has also adjusted to being pregnant, and you begin to feel better physically. Usually, morning sickness has lessened or quit completely, you may have more energy, and you may have an increase in appetite. The second trimester is also a time when the fetus is growing rapidly. At the end of the sixth month, the fetus is about 9 inches long and weighs about 1 pounds. You will likely begin to feel the baby move (quickening) between 18 and 20 weeks of the pregnancy. BODY CHANGES Your body goes through many changes during pregnancy. The changes vary from woman to woman.  Your weight will continue to increase. You will notice your lower abdomen bulging out. You may begin to get stretch marks on your hips, abdomen, and breasts. You may develop headaches that can be relieved by medicines approved by your health care provider. You may urinate more often because the fetus is pressing on your bladder. You may develop or continue to have heartburn as a result of your pregnancy. You may develop constipation because certain hormones are causing the muscles that push waste through your intestines to slow down. You may develop hemorrhoids or swollen, bulging veins (varicose veins). You may have back pain because of the weight gain and pregnancy hormones relaxing your joints between the bones in your pelvis and as a result of a shift in weight and the muscles that support your balance. Your breasts will continue to grow and be tender. Your gums may bleed and may be sensitive to brushing  and flossing. Dark spots or blotches (chloasma, mask of pregnancy) may develop on your face. This will likely fade after the baby is born. A dark line from your belly button to the pubic area (linea nigra) may appear. This will likely fade after the baby is born. You may have changes in your hair. These can include thickening of your hair, rapid growth, and changes in texture. Some women also have hair loss during or after pregnancy, or hair that feels dry or thin. Your hair will most likely return to normal after your baby is born. WHAT TO EXPECT AT YOUR PRENATAL VISITS During a routine prenatal visit: You will be weighed to make sure you and the fetus are growing normally. Your blood pressure will be taken. Your abdomen will be measured to track your baby's growth. The fetal heartbeat will be listened to. Any test results from the previous visit will be discussed. Your health care provider  may ask you: How you are feeling. If you are feeling the baby move. If you have had any abnormal symptoms, such as leaking fluid, bleeding, severe headaches, or abdominal cramping. If you have any questions. Other tests that may be performed during your second trimester include: Blood tests that check for: Low iron levels (anemia). Gestational diabetes (between 24 and 28 weeks). Rh antibodies. Urine tests to check for infections, diabetes, or protein in the urine. An ultrasound to confirm the proper growth and development of the baby. An amniocentesis to check for possible genetic problems. Fetal screens for spina bifida and Down syndrome. HOME CARE INSTRUCTIONS  Avoid all smoking, herbs, alcohol, and unprescribed drugs. These chemicals affect the formation and growth of the baby. Follow your health care provider's instructions regarding medicine use. There are medicines that are either safe or unsafe to take during pregnancy. Exercise only as directed by your health care provider. Experiencing  uterine cramps is a good sign to stop exercising. Continue to eat regular, healthy meals. Wear a good support bra for breast tenderness. Do not use hot tubs, steam rooms, or saunas. Wear your seat belt at all times when driving. Avoid raw meat, uncooked cheese, cat litter boxes, and soil used by cats. These carry germs that can cause birth defects in the baby. Take your prenatal vitamins. Try taking a stool softener (if your health care provider approves) if you develop constipation. Eat more high-fiber foods, such as fresh vegetables or fruit and whole grains. Drink plenty of fluids to keep your urine clear or pale yellow. Take warm sitz baths to soothe any pain or discomfort caused by hemorrhoids. Use hemorrhoid cream if your health care provider approves. If you develop varicose veins, wear support hose. Elevate your feet for 15 minutes, 3-4 times a day. Limit salt in your diet. Avoid heavy lifting, wear low heel shoes, and practice good posture. Rest with your legs elevated if you have leg cramps or low back pain. Visit your dentist if you have not gone yet during your pregnancy. Use a soft toothbrush to brush your teeth and be gentle when you floss. A sexual relationship may be continued unless your health care provider directs you otherwise. Continue to go to all your prenatal visits as directed by your health care provider. SEEK MEDICAL CARE IF:  You have dizziness. You have mild pelvic cramps, pelvic pressure, or nagging pain in the abdominal area. You have persistent nausea, vomiting, or diarrhea. You have a bad smelling vaginal discharge. You have pain with urination. SEEK IMMEDIATE MEDICAL CARE IF:  You have a fever. You are leaking fluid from your vagina. You have spotting or bleeding from your vagina. You have severe abdominal cramping or pain. You have rapid weight gain or loss. You have shortness of breath with chest pain. You notice sudden or extreme swelling of your face,  hands, ankles, feet, or legs. You have not felt your baby move in over an hour. You have severe headaches that do not go away with medicine. You have vision changes. Document Released: 07/17/2001 Document Revised: 07/28/2013 Document Reviewed: 09/23/2012 [facility] Patient Information 2015 [place], [place]. This information is not intended to replace advice given to you by your health care provider. Make sure you discuss any questions you have with your health care provider.

## 2024-03-02 ENCOUNTER — Other Ambulatory Visit: Payer: Self-pay | Admitting: Adult Health

## 2024-03-11 ENCOUNTER — Ambulatory Visit: Admitting: Advanced Practice Midwife

## 2024-03-11 ENCOUNTER — Other Ambulatory Visit

## 2024-03-11 VITALS — BP 106/64 | HR 84 | Wt 174.0 lb

## 2024-03-11 DIAGNOSIS — Z3A28 28 weeks gestation of pregnancy: Secondary | ICD-10-CM | POA: Diagnosis not present

## 2024-03-11 DIAGNOSIS — Z3403 Encounter for supervision of normal first pregnancy, third trimester: Secondary | ICD-10-CM | POA: Diagnosis not present

## 2024-03-11 DIAGNOSIS — Z131 Encounter for screening for diabetes mellitus: Secondary | ICD-10-CM

## 2024-03-11 NOTE — Progress Notes (Signed)
   LOW-RISK PREGNANCY VISIT Patient name: Kelly Ibarra MRN [id number]  Date of birth: 2006/03/09 Chief Complaint:   Routine Prenatal Visit  History of Present Illness:   Kelly Ibarra is a 18 y.o. G1P0 female at [redacted]w[redacted]d with an Estimated Date of Delivery: 06/02/24 being seen today for ongoing management of a low-risk pregnancy.  Today she reports some LBP; needs note to sit while at work; has rash on ankles. Contractions: Not present. Vag. Bleeding: None.  Movement: Present. denies leaking of fluid. Review of Systems:   Pertinent items are noted in HPI Denies abnormal vaginal discharge w/ itching/odor/irritation, headaches, visual changes, shortness of breath, chest pain, abdominal pain, severe nausea/vomiting, or problems with urination or bowel movements unless otherwise stated above. Pertinent History Reviewed:  Reviewed past medical,surgical, social, obstetrical and family history.  Reviewed problem list, medications and allergies. Physical Assessment:   Vitals:   03/11/24 0928  BP: 106/64  Pulse: 84  Weight: 174 lb (78.9 kg)  Body mass index is 29.71 kg/m.        Physical Examination:   General appearance: Well appearing, and in no distress  Mental status: Alert, oriented to person, place, and time  Skin: Warm & dry  Cardiovascular: Normal heart rate noted  Respiratory: Normal respiratory effort, no distress  Abdomen: Soft, gravid, nontender  Pelvic: Cervical exam deferred         Extremities:    Fetal Status: Fetal Heart Rate (bpm): 140 Fundal Height: 27 cm Movement: Present    No results found for this or any previous visit (from the past 24 hours).  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  Labs/procedures today: PN2  Plan:  Continue routine obstetrical care   Reviewed: Preterm labor symptoms and general obstetric precautions including but not limited to vaginal bleeding, contractions, leaking of fluid and fetal movement were reviewed in detail with the patient.  All questions were answered. Has home bp cuff. Check bp weekly, let us  know if >140/90.   Follow-up: Return in about 2 weeks (around 03/25/2024) for LROB.  No orders of the defined types were placed in this encounter.  Suzen JONETTA Gentry CNM 03/11/2024 10:09 AM

## 2024-03-11 NOTE — Patient Instructions (Signed)
 Kelly Ibarra, thank you for choosing our office today! We appreciate the opportunity to meet your healthcare needs. You may receive a short survey by mail, e-mail, or through [employer]. If you are happy with your care we would appreciate if you could take just a few minutes to complete the survey questions. We read all of your comments and take your feedback very seriously. Thank you again for choosing our office.  Center for [employer] Team at [facility]  [facility] at [facility] ([address] [place], [place] [address]) Entrance C, located off of [address] Free 24/7 valet parking   CLASSES: Go to [email or web address] to register for classes (childbirth, breastfeeding, waterbirth, infant CPR, daddy bootcamp, etc.)  Call the office [phone]) or go to [facility] if: You begin to have strong, frequent contractions Your water breaks.  Sometimes it is a big gush of fluid, sometimes it is just a trickle that keeps getting your panties wet or running down your legs You have vaginal bleeding.  It is normal to have a small amount of spotting if your cervix was checked.  You don't feel your baby moving like normal.  If you don't, get you something to eat and drink and lay down and focus on feeling your baby move.   If your baby is still not moving like normal, you should call the office or go to [facility].  Call the office [phone]) or go to [facility] hospital for these signs of pre-eclampsia: Severe headache that does not go away with Tylenol  Visual changes- seeing spots, double, blurred vision Pain under your right breast or upper abdomen that does not go away with Tums or heartburn medicine Nausea and/or vomiting Severe swelling in your hands, feet, and face   Tdap Vaccine It is recommended that you get the Tdap vaccine during the third trimester of EACH pregnancy to help protect your baby from getting pertussis (whooping cough) 27-36 weeks is the BEST time to do  this so that you can pass the protection on to your baby. During pregnancy is better than after pregnancy, but if you are unable to get it during pregnancy it will be offered at the hospital.  You can get this vaccine with us , at the health department, your family doctor, or some local pharmacies Everyone who will be around your baby should also be up-to-date on their vaccines before the baby comes. Adults (who are not pregnant) only need 1 dose of Tdap during adulthood.   [facility] Pediatricians/Family Doctors [facility]): [address] Luba BROCKS, [phone]           [facility]: [address], [phone]                [facility]): [address], [phone] (call to ask if accepting patients) [facility] Department: [address], [place], [phone]    [facility] Pediatricians/Family Doctors [facility]): [phone] [address], [phone] [facility]: [address] [place], [phone] [facility]: [address], [phone]  [facility] Doctors  [facility]): [phone] [facility]: [address], [phone]   [facility] Doctors [facility]: [address], [phone]  [facility] Family Doctors  [facility]: [phone], [phone]  Home Blood Pressure Monitoring for Patients   Your provider has recommended that you check your  blood pressure (BP) at least once a week at home. If you do not have a blood pressure cuff at home, one will be provided for you. Contact your provider if you have not received your monitor within 1 week.   Helpful Tips for Accurate Home Blood Pressure Checks  Don't smoke, exercise, or drink caffeine 30 minutes before checking your BP Use the restroom before checking your BP (a full bladder can raise your  pressure) Relax in a comfortable upright chair Feet on the ground Left arm resting comfortably on a flat surface at the level of your heart Legs uncrossed Back supported Sit quietly and don't talk Place the cuff on your bare arm Adjust snuggly, so that only two fingertips can fit between your skin and the top of the cuff Check 2 readings separated by at least one minute Keep a log of your BP readings For a visual, please reference this diagram: http://ccnc.care/bpdiagram  Provider Name: [facility]     Phone: [phone]  Zone 1: ALL CLEAR  Continue to monitor your symptoms:  BP reading is less than 140 (top number) or less than 90 (bottom number)  No right upper stomach pain No headaches or seeing spots No feeling nauseated or throwing up No swelling in face and hands  Zone 2: CAUTION Call your doctor's office for any of the following:  BP reading is greater than 140 (top number) or greater than 90 (bottom number)  Stomach pain under your ribs in the middle or right side Headaches or seeing spots Feeling nauseated or throwing up Swelling in face and hands  Zone 3: EMERGENCY  Seek immediate medical care if you have any of the following:  BP reading is greater than160 (top number) or greater than 110 (bottom number) Severe headaches not improving with Tylenol  Serious difficulty catching your breath Any worsening symptoms from Zone 2   Third Trimester of Pregnancy The third trimester is from week 29 through week 42, months 7 through 9. The third trimester is a time when the fetus is growing rapidly. At the end of the ninth month, the fetus is about 20 inches in length and weighs 6-10 pounds.  BODY CHANGES Your body goes through many changes during pregnancy. The changes vary from woman to woman.  Your weight will continue to increase. You can expect to gain 25-35 pounds (11-16 kg) by the end of the pregnancy. You may begin to get stretch marks on your hips, abdomen,  and breasts. You may urinate more often because the fetus is moving lower into your pelvis and pressing on your bladder. You may develop or continue to have heartburn as a result of your pregnancy. You may develop constipation because certain hormones are causing the muscles that push waste through your intestines to slow down. You may develop hemorrhoids or swollen, bulging veins (varicose veins). You may have pelvic pain because of the weight gain and pregnancy hormones relaxing your joints between the bones in your pelvis. Backaches may result from overexertion of the muscles supporting your posture. You may have changes in your hair. These can include thickening of your hair, rapid growth, and changes in texture. Some women also have hair loss during or after pregnancy, or hair that feels dry or thin. Your hair will most likely return to normal after your baby is born. Your breasts will continue to grow and be tender. A yellow discharge may leak from your breasts called colostrum. Your belly button may stick out. You may  feel short of breath because of your expanding uterus. You may notice the fetus dropping, or moving lower in your abdomen. You may have a bloody mucus discharge. This usually occurs a few days to a week before labor begins. Your cervix becomes thin and soft (effaced) near your due date. WHAT TO EXPECT AT YOUR PRENATAL EXAMS  You will have prenatal exams every 2 weeks until week 36. Then, you will have weekly prenatal exams. During a routine prenatal visit: You will be weighed to make sure you and the fetus are growing normally. Your blood pressure is taken. Your abdomen will be measured to track your baby's growth. The fetal heartbeat will be listened to. Any test results from the previous visit will be discussed. You may have a cervical check near your due date to see if you have effaced. At around 36 weeks, your caregiver will check your cervix. At the same time, your  caregiver will also perform a test on the secretions of the vaginal tissue. This test is to determine if a type of bacteria, Group B streptococcus, is present. Your caregiver will explain this further. Your caregiver may ask you: What your birth plan is. How you are feeling. If you are feeling the baby move. If you have had any abnormal symptoms, such as leaking fluid, bleeding, severe headaches, or abdominal cramping. If you have any questions. Other tests or screenings that may be performed during your third trimester include: Blood tests that check for low iron levels (anemia). Fetal testing to check the health, activity level, and growth of the fetus. Testing is done if you have certain medical conditions or if there are problems during the pregnancy. FALSE LABOR You may feel small, irregular contractions that eventually go away. These are called Braxton Hicks contractions, or false labor. Contractions may last for hours, days, or even weeks before true labor sets in. If contractions come at regular intervals, intensify, or become painful, it is best to be seen by your caregiver.  SIGNS OF LABOR  Menstrual-like cramps. Contractions that are 5 minutes apart or less. Contractions that start on the top of the uterus and spread down to the lower abdomen and back. A sense of increased pelvic pressure or back pain. A watery or bloody mucus discharge that comes from the vagina. If you have any of these signs before the 37th week of pregnancy, call your caregiver right away. You need to go to the hospital to get checked immediately. HOME CARE INSTRUCTIONS  Avoid all smoking, herbs, alcohol, and unprescribed drugs. These chemicals affect the formation and growth of the baby. Follow your caregiver's instructions regarding medicine use. There are medicines that are either safe or unsafe to take during pregnancy. Exercise only as directed by your caregiver. Experiencing uterine cramps is a good sign to  stop exercising. Continue to eat regular, healthy meals. Wear a good support bra for breast tenderness. Do not use hot tubs, steam rooms, or saunas. Wear your seat belt at all times when driving. Avoid raw meat, uncooked cheese, cat litter boxes, and soil used by cats. These carry germs that can cause birth defects in the baby. Take your prenatal vitamins. Try taking a stool softener (if your caregiver approves) if you develop constipation. Eat more high-fiber foods, such as fresh vegetables or fruit and whole grains. Drink plenty of fluids to keep your urine clear or pale yellow. Take warm sitz baths to soothe any pain or discomfort caused by hemorrhoids. Use hemorrhoid cream if  your caregiver approves. If you develop varicose veins, wear support hose. Elevate your feet for 15 minutes, 3-4 times a day. Limit salt in your diet. Avoid heavy lifting, wear low heal shoes, and practice good posture. Rest a lot with your legs elevated if you have leg cramps or low back pain. Visit your dentist if you have not gone during your pregnancy. Use a soft toothbrush to brush your teeth and be gentle when you floss. A sexual relationship may be continued unless your caregiver directs you otherwise. Do not travel far distances unless it is absolutely necessary and only with the approval of your caregiver. Take prenatal classes to understand, practice, and ask questions about the labor and delivery. Make a trial run to the hospital. Pack your hospital bag. Prepare the baby's nursery. Continue to go to all your prenatal visits as directed by your caregiver. SEEK MEDICAL CARE IF: You are unsure if you are in labor or if your water has broken. You have dizziness. You have mild pelvic cramps, pelvic pressure, or nagging pain in your abdominal area. You have persistent nausea, vomiting, or diarrhea. You have a bad smelling vaginal discharge. You have pain with urination. SEEK IMMEDIATE MEDICAL CARE IF:  You  have a fever. You are leaking fluid from your vagina. You have spotting or bleeding from your vagina. You have severe abdominal cramping or pain. You have rapid weight loss or gain. You have shortness of breath with chest pain. You notice sudden or extreme swelling of your face, hands, ankles, feet, or legs. You have not felt your baby move in over an hour. You have severe headaches that do not go away with medicine. You have vision changes. Document Released: 07/17/2001 Document Revised: 07/28/2013 Document Reviewed: 09/23/2012 [facility] Patient Information 2015 [place], [place]. This information is not intended to replace advice given to you by your health care provider. Make sure you discuss any questions you have with your health care provider.

## 2024-03-12 LAB — CBC
Hematocrit: 26.1 % — ABNORMAL LOW (ref 34.0–46.6)
Hemoglobin: 8.1 g/dL — ABNORMAL LOW (ref 11.1–15.9)
MCH: 24.9 pg — ABNORMAL LOW (ref 26.6–33.0)
MCHC: 31 g/dL — ABNORMAL LOW (ref 31.5–35.7)
MCV: 80 fL (ref 79–97)
Platelets: 219 x10E3/uL (ref 150–450)
RBC: 3.25 x10E6/uL — ABNORMAL LOW (ref 3.77–5.28)
RDW: 13 % (ref 11.7–15.4)
WBC: 5.8 x10E3/uL (ref 3.4–10.8)

## 2024-03-12 LAB — GLUCOSE TOLERANCE, 2 HOURS W/ 1HR
Glucose, 1 hour: 54 mg/dL — ABNORMAL LOW (ref 70–179)
Glucose, 2 hour: 61 mg/dL — ABNORMAL LOW (ref 70–152)
Glucose, Fasting: 71 mg/dL (ref 70–91)

## 2024-03-12 LAB — HIV ANTIBODY (ROUTINE TESTING W REFLEX): HIV Screen 4th Generation wRfx: NONREACTIVE

## 2024-03-12 LAB — ANTIBODY SCREEN: Antibody Screen: NEGATIVE

## 2024-03-12 LAB — RPR: RPR Ser Ql: NONREACTIVE

## 2024-03-18 ENCOUNTER — Other Ambulatory Visit: Payer: Self-pay | Admitting: Advanced Practice Midwife

## 2024-03-18 ENCOUNTER — Ambulatory Visit: Payer: Self-pay | Admitting: Advanced Practice Midwife

## 2024-03-18 DIAGNOSIS — O99019 Anemia complicating pregnancy, unspecified trimester: Secondary | ICD-10-CM | POA: Insufficient documentation

## 2024-03-18 DIAGNOSIS — O99013 Anemia complicating pregnancy, third trimester: Secondary | ICD-10-CM

## 2024-03-18 MED ORDER — FERROUS FUMARATE 324 (106 FE) MG PO TABS: 1.0000 | ORAL_TABLET | ORAL | 3 refills | Status: AC

## 2024-03-19 ENCOUNTER — Telehealth: Payer: Self-pay

## 2024-03-19 NOTE — Telephone Encounter (Addendum)
 Hello,  Patient will be scheduled as soon as possible.  Auth Submission: NO AUTH NEEDED Site of care: Site of care: [facility] Payer: [employer] ppo Medication & CPT/J Code(s) submitted: Venofer  (Iron  Sucrose) J1756 Diagnosis Code:  Route of submission (phone, fax, portal): phone Phone # Fax # Auth type: Buy/Bill PB Units/visits requested: 500mg  x 2 doses Reference number: [id number] Approval from: 03/19/24 to 07/19/24

## 2024-03-25 ENCOUNTER — Ambulatory Visit: Admitting: Advanced Practice Midwife

## 2024-03-25 ENCOUNTER — Encounter: Payer: Self-pay | Admitting: Advanced Practice Midwife

## 2024-03-25 VITALS — BP 101/68 | HR 113 | Wt 178.0 lb

## 2024-03-25 DIAGNOSIS — Z3A30 30 weeks gestation of pregnancy: Secondary | ICD-10-CM

## 2024-03-25 DIAGNOSIS — Z3402 Encounter for supervision of normal first pregnancy, second trimester: Secondary | ICD-10-CM

## 2024-03-25 DIAGNOSIS — Z3403 Encounter for supervision of normal first pregnancy, third trimester: Secondary | ICD-10-CM

## 2024-03-25 NOTE — Progress Notes (Signed)
   LOW-RISK PREGNANCY VISIT Patient name: Kelly Ibarra MRN [id number]  Date of birth: 05-11-2006 Chief Complaint:   Routine Prenatal Visit (Tdap information given)  History of Present Illness:   Kelly Ibarra is a 18 y.o. G1P0 female at [redacted]w[redacted]d with an Estimated Date of Delivery: 06/02/24 being seen today for ongoing management of a low-risk pregnancy.  Today she reports doing well; has first Venofer  appt tomorrow. Contractions: Not present.  .  Movement: Present. denies leaking of fluid. Review of Systems:   Pertinent items are noted in HPI Denies abnormal vaginal discharge w/ itching/odor/irritation, headaches, visual changes, shortness of breath, chest pain, abdominal pain, severe nausea/vomiting, or problems with urination or bowel movements unless otherwise stated above. Pertinent History Reviewed:  Reviewed past medical,surgical, social, obstetrical and family history.  Reviewed problem list, medications and allergies. Physical Assessment:   Vitals:   03/25/24 0857  BP: 101/68  Pulse: (!) 113  Weight: 178 lb (80.7 kg)  Body mass index is 30.39 kg/m.        Physical Examination:   General appearance: Well appearing, and in no distress  Mental status: Alert, oriented to person, place, and time  Skin: Warm & dry  Cardiovascular: Normal heart rate noted  Respiratory: Normal respiratory effort, no distress  Abdomen: Soft, gravid, nontender  Pelvic: Cervical exam deferred         Extremities: Edema: None  Fetal Status: Fetal Heart Rate (bpm): 151 Fundal Height: 30 cm Movement: Present    No results found for this or any previous visit (from the past 24 hours).  Assessment & Plan:  1) Low-risk pregnancy G1P0 at [redacted]w[redacted]d with an Estimated Date of Delivery: 06/02/24   2) Anemia, Hgb 8.1 on 8/6; has first Venofer  infusion tomorrow   Meds: No orders of the defined types were placed in this encounter.  Labs/procedures today: Tdap info given (wants to defer til next visit)  Plan:   Continue routine obstetrical care   Reviewed: Preterm labor symptoms and general obstetric precautions including but not limited to vaginal bleeding, contractions, leaking of fluid and fetal movement were reviewed in detail with the patient.  All questions were answered. Has home bp cuff. Check bp weekly, let us  know if >140/90.   Follow-up: Return in about 2 weeks (around 04/08/2024) for LROB, in person.  No orders of the defined types were placed in this encounter.  Suzen JONETTA Gentry CNM 03/25/2024 9:24 AM

## 2024-03-25 NOTE — Progress Notes (Signed)
 1

## 2024-03-25 NOTE — Patient Instructions (Signed)
 Kelly Ibarra, thank you for choosing our office today! We appreciate the opportunity to meet your healthcare needs. You may receive a short survey by mail, e-mail, or through [employer]. If you are happy with your care we would appreciate if you could take just a few minutes to complete the survey questions. We read all of your comments and take your feedback very seriously. Thank you again for choosing our office.  Center for [employer] Team at [facility]  [facility] at [facility] ([address] [place], [place] [address]) Entrance C, located off of [address] Free 24/7 valet parking   CLASSES: Go to [email or web address] to register for classes (childbirth, breastfeeding, waterbirth, infant CPR, daddy bootcamp, etc.)  Call the office [phone]) or go to [facility] if: You begin to have strong, frequent contractions Your water breaks.  Sometimes it is a big gush of fluid, sometimes it is just a trickle that keeps getting your panties wet or running down your legs You have vaginal bleeding.  It is normal to have a small amount of spotting if your cervix was checked.  You don't feel your baby moving like normal.  If you don't, get you something to eat and drink and lay down and focus on feeling your baby move.   If your baby is still not moving like normal, you should call the office or go to [facility].  Call the office [phone]) or go to [facility] hospital for these signs of pre-eclampsia: Severe headache that does not go away with Tylenol  Visual changes- seeing spots, double, blurred vision Pain under your right breast or upper abdomen that does not go away with Tums or heartburn medicine Nausea and/or vomiting Severe swelling in your hands, feet, and face   Tdap Vaccine It is recommended that you get the Tdap vaccine during the third trimester of EACH pregnancy to help protect your baby from getting pertussis (whooping cough) 27-36 weeks is the BEST time to do  this so that you can pass the protection on to your baby. During pregnancy is better than after pregnancy, but if you are unable to get it during pregnancy it will be offered at the hospital.  You can get this vaccine with us , at the health department, your family doctor, or some local pharmacies Everyone who will be around your baby should also be up-to-date on their vaccines before the baby comes. Adults (who are not pregnant) only need 1 dose of Tdap during adulthood.   [facility] Pediatricians/Family Doctors [facility]): [address] Luba BROCKS, [phone]           [facility]: [address], [phone]                [facility]): [address], [phone] (call to ask if accepting patients) [facility] Department: [address], [place], [phone]    [facility] Pediatricians/Family Doctors [facility]): [phone] [address], [phone] [facility]: [address] [place], [phone] [facility]: [address], [phone]  [facility] Doctors  [facility]): [phone] [facility]: [address], [phone]   [facility] Doctors [facility]: [address], [phone]  [facility] Family Doctors  [facility]: [phone], [phone]  Home Blood Pressure Monitoring for Patients   Your provider has recommended that you check your  blood pressure (BP) at least once a week at home. If you do not have a blood pressure cuff at home, one will be provided for you. Contact your provider if you have not received your monitor within 1 week.   Helpful Tips for Accurate Home Blood Pressure Checks  Don't smoke, exercise, or drink caffeine 30 minutes before checking your BP Use the restroom before checking your BP (a full bladder can raise your  pressure) Relax in a comfortable upright chair Feet on the ground Left arm resting comfortably on a flat surface at the level of your heart Legs uncrossed Back supported Sit quietly and don't talk Place the cuff on your bare arm Adjust snuggly, so that only two fingertips can fit between your skin and the top of the cuff Check 2 readings separated by at least one minute Keep a log of your BP readings For a visual, please reference this diagram: http://ccnc.care/bpdiagram  Provider Name: [facility]     Phone: [phone]  Zone 1: ALL CLEAR  Continue to monitor your symptoms:  BP reading is less than 140 (top number) or less than 90 (bottom number)  No right upper stomach pain No headaches or seeing spots No feeling nauseated or throwing up No swelling in face and hands  Zone 2: CAUTION Call your doctor's office for any of the following:  BP reading is greater than 140 (top number) or greater than 90 (bottom number)  Stomach pain under your ribs in the middle or right side Headaches or seeing spots Feeling nauseated or throwing up Swelling in face and hands  Zone 3: EMERGENCY  Seek immediate medical care if you have any of the following:  BP reading is greater than160 (top number) or greater than 110 (bottom number) Severe headaches not improving with Tylenol  Serious difficulty catching your breath Any worsening symptoms from Zone 2   Third Trimester of Pregnancy The third trimester is from week 29 through week 42, months 7 through 9. The third trimester is a time when the fetus is growing rapidly. At the end of the ninth month, the fetus is about 20 inches in length and weighs 6-10 pounds.  BODY CHANGES Your body goes through many changes during pregnancy. The changes vary from woman to woman.  Your weight will continue to increase. You can expect to gain 25-35 pounds (11-16 kg) by the end of the pregnancy. You may begin to get stretch marks on your hips, abdomen,  and breasts. You may urinate more often because the fetus is moving lower into your pelvis and pressing on your bladder. You may develop or continue to have heartburn as a result of your pregnancy. You may develop constipation because certain hormones are causing the muscles that push waste through your intestines to slow down. You may develop hemorrhoids or swollen, bulging veins (varicose veins). You may have pelvic pain because of the weight gain and pregnancy hormones relaxing your joints between the bones in your pelvis. Backaches may result from overexertion of the muscles supporting your posture. You may have changes in your hair. These can include thickening of your hair, rapid growth, and changes in texture. Some women also have hair loss during or after pregnancy, or hair that feels dry or thin. Your hair will most likely return to normal after your baby is born. Your breasts will continue to grow and be tender. A yellow discharge may leak from your breasts called colostrum. Your belly button may stick out. You may  feel short of breath because of your expanding uterus. You may notice the fetus dropping, or moving lower in your abdomen. You may have a bloody mucus discharge. This usually occurs a few days to a week before labor begins. Your cervix becomes thin and soft (effaced) near your due date. WHAT TO EXPECT AT YOUR PRENATAL EXAMS  You will have prenatal exams every 2 weeks until week 36. Then, you will have weekly prenatal exams. During a routine prenatal visit: You will be weighed to make sure you and the fetus are growing normally. Your blood pressure is taken. Your abdomen will be measured to track your baby's growth. The fetal heartbeat will be listened to. Any test results from the previous visit will be discussed. You may have a cervical check near your due date to see if you have effaced. At around 36 weeks, your caregiver will check your cervix. At the same time, your  caregiver will also perform a test on the secretions of the vaginal tissue. This test is to determine if a type of bacteria, Group B streptococcus, is present. Your caregiver will explain this further. Your caregiver may ask you: What your birth plan is. How you are feeling. If you are feeling the baby move. If you have had any abnormal symptoms, such as leaking fluid, bleeding, severe headaches, or abdominal cramping. If you have any questions. Other tests or screenings that may be performed during your third trimester include: Blood tests that check for low iron levels (anemia). Fetal testing to check the health, activity level, and growth of the fetus. Testing is done if you have certain medical conditions or if there are problems during the pregnancy. FALSE LABOR You may feel small, irregular contractions that eventually go away. These are called Braxton Hicks contractions, or false labor. Contractions may last for hours, days, or even weeks before true labor sets in. If contractions come at regular intervals, intensify, or become painful, it is best to be seen by your caregiver.  SIGNS OF LABOR  Menstrual-like cramps. Contractions that are 5 minutes apart or less. Contractions that start on the top of the uterus and spread down to the lower abdomen and back. A sense of increased pelvic pressure or back pain. A watery or bloody mucus discharge that comes from the vagina. If you have any of these signs before the 37th week of pregnancy, call your caregiver right away. You need to go to the hospital to get checked immediately. HOME CARE INSTRUCTIONS  Avoid all smoking, herbs, alcohol, and unprescribed drugs. These chemicals affect the formation and growth of the baby. Follow your caregiver's instructions regarding medicine use. There are medicines that are either safe or unsafe to take during pregnancy. Exercise only as directed by your caregiver. Experiencing uterine cramps is a good sign to  stop exercising. Continue to eat regular, healthy meals. Wear a good support bra for breast tenderness. Do not use hot tubs, steam rooms, or saunas. Wear your seat belt at all times when driving. Avoid raw meat, uncooked cheese, cat litter boxes, and soil used by cats. These carry germs that can cause birth defects in the baby. Take your prenatal vitamins. Try taking a stool softener (if your caregiver approves) if you develop constipation. Eat more high-fiber foods, such as fresh vegetables or fruit and whole grains. Drink plenty of fluids to keep your urine clear or pale yellow. Take warm sitz baths to soothe any pain or discomfort caused by hemorrhoids. Use hemorrhoid cream if  your caregiver approves. If you develop varicose veins, wear support hose. Elevate your feet for 15 minutes, 3-4 times a day. Limit salt in your diet. Avoid heavy lifting, wear low heal shoes, and practice good posture. Rest a lot with your legs elevated if you have leg cramps or low back pain. Visit your dentist if you have not gone during your pregnancy. Use a soft toothbrush to brush your teeth and be gentle when you floss. A sexual relationship may be continued unless your caregiver directs you otherwise. Do not travel far distances unless it is absolutely necessary and only with the approval of your caregiver. Take prenatal classes to understand, practice, and ask questions about the labor and delivery. Make a trial run to the hospital. Pack your hospital bag. Prepare the baby's nursery. Continue to go to all your prenatal visits as directed by your caregiver. SEEK MEDICAL CARE IF: You are unsure if you are in labor or if your water has broken. You have dizziness. You have mild pelvic cramps, pelvic pressure, or nagging pain in your abdominal area. You have persistent nausea, vomiting, or diarrhea. You have a bad smelling vaginal discharge. You have pain with urination. SEEK IMMEDIATE MEDICAL CARE IF:  You  have a fever. You are leaking fluid from your vagina. You have spotting or bleeding from your vagina. You have severe abdominal cramping or pain. You have rapid weight loss or gain. You have shortness of breath with chest pain. You notice sudden or extreme swelling of your face, hands, ankles, feet, or legs. You have not felt your baby move in over an hour. You have severe headaches that do not go away with medicine. You have vision changes. Document Released: 07/17/2001 Document Revised: 07/28/2013 Document Reviewed: 09/23/2012 [facility] Patient Information 2015 [place], [place]. This information is not intended to replace advice given to you by your health care provider. Make sure you discuss any questions you have with your health care provider.

## 2024-03-26 ENCOUNTER — Encounter: Attending: Advanced Practice Midwife | Admitting: Emergency Medicine

## 2024-03-26 VITALS — BP 105/65 | HR 85 | Temp 98.2°F | Resp 16

## 2024-03-26 DIAGNOSIS — O99013 Anemia complicating pregnancy, third trimester: Secondary | ICD-10-CM | POA: Diagnosis not present

## 2024-03-26 DIAGNOSIS — Z3A30 30 weeks gestation of pregnancy: Secondary | ICD-10-CM

## 2024-03-26 DIAGNOSIS — D509 Iron deficiency anemia, unspecified: Secondary | ICD-10-CM

## 2024-03-26 MED ORDER — ACETAMINOPHEN 325 MG PO TABS
650.0000 mg | ORAL_TABLET | Freq: Once | ORAL | Status: AC
  Administered 2024-03-26: 650 mg via ORAL

## 2024-03-26 MED ORDER — DIPHENHYDRAMINE HCL 25 MG PO CAPS
25.0000 mg | ORAL_CAPSULE | Freq: Once | ORAL | Status: AC
  Administered 2024-03-26: 25 mg via ORAL

## 2024-03-26 MED ORDER — SODIUM CHLORIDE 0.9 % IV SOLN
500.0000 mg | Freq: Once | INTRAVENOUS | Status: AC
  Administered 2024-03-26: 500 mg via INTRAVENOUS
  Filled 2024-03-26: qty 20

## 2024-03-26 NOTE — Progress Notes (Signed)
 Diagnosis: Iron  Deficiency Anemia  Provider:  Suzen Gentry CNM  Procedure: IV Infusion  IV Type: Peripheral, IV Location: L Antecubital  Venofer  (Iron  Sucrose), Dose: 500 mg  Infusion Start Time: 1004  Infusion Stop Time: 1417  Post Infusion IV Care: Observation period completed and Peripheral IV Discontinued  Discharge: Condition: Good, Destination: Home . AVS Declined  Performed by:  Delon ONEIDA Officer, RN

## 2024-03-29 ENCOUNTER — Emergency Department (HOSPITAL_COMMUNITY)

## 2024-03-29 ENCOUNTER — Emergency Department (HOSPITAL_COMMUNITY)
Admission: EM | Admit: 2024-03-29 | Discharge: 2024-03-29 | Disposition: A | Discharge status: Home / Self Care | Attending: Student | Admitting: Student

## 2024-03-29 ENCOUNTER — Other Ambulatory Visit: Payer: Self-pay

## 2024-03-29 ENCOUNTER — Encounter (HOSPITAL_COMMUNITY): Payer: Self-pay | Admitting: Emergency Medicine

## 2024-03-29 DIAGNOSIS — J069 Acute upper respiratory infection, unspecified: Secondary | ICD-10-CM | POA: Diagnosis not present

## 2024-03-29 DIAGNOSIS — Z3A30 30 weeks gestation of pregnancy: Secondary | ICD-10-CM | POA: Diagnosis not present

## 2024-03-29 DIAGNOSIS — J45909 Unspecified asthma, uncomplicated: Secondary | ICD-10-CM | POA: Diagnosis not present

## 2024-03-29 DIAGNOSIS — Z7982 Long term (current) use of aspirin: Secondary | ICD-10-CM | POA: Insufficient documentation

## 2024-03-29 DIAGNOSIS — O26893 Other specified pregnancy related conditions, third trimester: Secondary | ICD-10-CM | POA: Diagnosis present

## 2024-03-29 DIAGNOSIS — U071 COVID-19: Secondary | ICD-10-CM | POA: Insufficient documentation

## 2024-03-29 DIAGNOSIS — O98513 Other viral diseases complicating pregnancy, third trimester: Secondary | ICD-10-CM | POA: Insufficient documentation

## 2024-03-29 LAB — RESP PANEL BY RT-PCR (RSV, FLU A&B, COVID)  RVPGX2
Influenza A by PCR: NEGATIVE
Influenza B by PCR: NEGATIVE
Resp Syncytial Virus by PCR: NEGATIVE
SARS Coronavirus 2 by RT PCR: POSITIVE — AB

## 2024-03-29 LAB — GROUP A STREP BY PCR: Group A Strep by PCR: NOT DETECTED

## 2024-03-29 MED ORDER — ALBUTEROL SULFATE (2.5 MG/3ML) 0.083% IN NEBU
2.5000 mg | INHALATION_SOLUTION | Freq: Once | RESPIRATORY_TRACT | Status: AC
  Administered 2024-03-29: 2.5 mg via RESPIRATORY_TRACT
  Filled 2024-03-29: qty 3

## 2024-03-29 MED ORDER — ACETAMINOPHEN 325 MG PO TABS
650.0000 mg | ORAL_TABLET | Freq: Once | ORAL | Status: AC
  Administered 2024-03-29: 650 mg via ORAL
  Filled 2024-03-29: qty 2

## 2024-03-29 NOTE — Discharge Instructions (Addendum)
 You are COVID positive. Please follow up with OBGYN or family doctor.   You can take [phone] mg of Tylenol  every 8 hours as needed for muscle aches/fever. Stay well hydrated with primary water. Use albuterol  inhaler as need. Try warm tea with honey and lemon. Try cough drops. Return to ER with CP, wheezing or worsening symptoms.

## 2024-03-29 NOTE — ED Provider Notes (Signed)
 [place] EMERGENCY DEPARTMENT AT [facility] Provider Note   CSN: [id number] Arrival date & time: 03/29/24  1657     Patient presents with: Cough   Kelly Ibarra is a 18 y.o. female who is [redacted] weeks pregnant, IDA, asthma and MDD presenting with complaint of productive cough for 3 days associated with body aches, fever/chills and congestion/runny nose. No documented fever at home. Mild SOB. No CP, abdominal pain or urinary symptoms.     Cough      Prior to Admission medications   Medication Sig Start Date End Date Taking? Authorizing Provider  albuterol  (VENTOLIN  HFA) 108 (90 Base) MCG/ACT inhaler Inhale 2 puffs into the lungs every 6 (six) hours as needed for wheezing or shortness of breath. 03/06/23   Iva Marty Saltness, MD  aspirin  EC 81 MG tablet Take 1 tablet (81 mg total) by mouth daily. Swallow whole. 11/20/23   Delores Nidia CROME, FNP  Budesonide  (PULMICORT  FLEXHALER) 90 MCG/ACT inhaler Inhale 2 puffs into the lungs daily. 01/24/24   Cari Arlean HERO, FNP  budesonide  (RHINOCORT  AQUA) 32 MCG/ACT nasal spray Place 2 sprays into both nostrils daily. 01/24/24   Cari Arlean HERO, FNP  cetirizine  (ZYRTEC ) 10 MG tablet Take 1 tablet (10 mg total) by mouth daily. 10/25/23   Iva Marty Saltness, MD  Emollient (CERAVE MOISTURIZING) CREA Apply 1 Application topically daily. 09/07/14   [provider]  EPINEPHrine 0.3 mg/0.3 mL IJ SOAJ injection Inject 0.3 mg into the muscle as needed for anaphylaxis. Patient not taking: Reported on 03/25/2024 01/11/23   [provider]  Ferrous Fumarate  (HEMOCYTE - 106 MG FE) 324 (106 Fe) MG TABS tablet Take 1 tablet (106 mg of iron  total) by mouth every other day. 03/18/24   Loreli Iha D, CNM  ondansetron  (ZOFRAN -ODT) 4 MG disintegrating tablet DISSOLVE 1 TABLET IN MOUTH EVERY 8 HOURS AS NEEDED FOR NAUSEA AND VOMITING 03/03/24   Signa Delon LABOR, NP  Prenatal Vit-Fe Fumarate-FA (PRENATAL COMPLETE) 14-0.4 MG TABS Take 1 tablet by mouth  daily. 10/08/23   Cooleen, Olam LABOR, NP  Soap & Cleansers (CERAVE SA BODY WASH EX) Apply 1 Application topically daily. 09/07/14   [provider]  Spacer/Aero-Holding Raguel FRENCH Use spacer as directed with inhaler. 06/07/23   Cari Arlean HERO, FNP    Allergies: Alpha-gal    Review of Systems  Respiratory:  Positive for cough.     Updated Vital Signs BP 119/72   Pulse (!) 101   Temp 98.2 F (36.8 C) (Oral)   Resp 17   Ht 5' 6 (1.676 m)   Wt 80.7 kg   LMP 08/27/2023   SpO2 100%   BMI 28.73 kg/m   Physical Exam Vitals and nursing note reviewed.  Constitutional:      General: She is not in acute distress.    Appearance: She is not toxic-appearing.  HENT:     Head: Normocephalic and atraumatic.     Right Ear: Tympanic membrane, ear canal and external ear normal.     Left Ear: Tympanic membrane, ear canal and external ear normal.     Nose: Congestion and rhinorrhea present.     Mouth/Throat:     Pharynx: Posterior oropharyngeal erythema present. No oropharyngeal exudate.  Eyes:     General: No scleral icterus.    Conjunctiva/sclera: Conjunctivae normal.  Cardiovascular:     Rate and Rhythm: Normal rate and regular rhythm.     Pulses: Normal pulses.     Heart  sounds: Normal heart sounds.  Pulmonary:     Effort: Pulmonary effort is normal. No respiratory distress.     Breath sounds: Normal breath sounds. No wheezing.  Abdominal:     General: Abdomen is flat. Bowel sounds are normal.     Palpations: Abdomen is soft.     Tenderness: There is no abdominal tenderness.  Musculoskeletal:     Right lower leg: No edema.     Left lower leg: No edema.  Skin:    General: Skin is warm and dry.     Findings: No lesion.  Neurological:     General: No focal deficit present.     Mental Status: She is alert and oriented to person, place, and time. Mental status is at baseline.     (all labs ordered are listed, but only abnormal results are displayed) Labs Reviewed  GROUP A  STREP BY PCR  RESP PANEL BY RT-PCR (RSV, FLU A&B, COVID)  [id number]    EKG: None  Radiology: DG Chest 2 View Result Date: 03/29/2024 CLINICAL DATA:  Shortness of breath and cough. EXAM: CHEST - 2 VIEW COMPARISON:  Chest radiograph dated 02/15/2019. FINDINGS: The heart size and mediastinal contours are within normal limits. Both lungs are clear. The visualized skeletal structures are unremarkable. IMPRESSION: No active cardiopulmonary disease. Electronically Signed   By: Vanetta Chou M.D.   On: 03/29/2024 17:43     Procedures   Medications Ordered in the ED  albuterol  (PROVENTIL ) (2.5 MG/3ML) 0.083% nebulizer solution 2.5 mg (2.5 mg Nebulization Given 03/29/24 1736)  acetaminophen  (TYLENOL ) tablet 650 mg (650 mg Oral Given 03/29/24 1736)                                    Medical Decision Making Amount and/or Complexity of Data Reviewed Radiology: ordered.  Risk OTC drugs. Prescription drug management.   This patient presents to the ED for concern of flu like symptoms, this involves an extensive number of treatment options, and is a complaint that carries with it a high risk of complications and morbidity.  The differential diagnosis includes pneumonia, viral URI with cough, otitis media, otitis externa, strep throat, mono, other viral pharyngitis, peritonsillar abscess   Lab Tests:  I personally interpreted labs.  The pertinent results include:   Resp panel COVID positive Strep test negative   Imaging Studies ordered:  I ordered imaging studies including chest x-ray  I independently visualized and interpreted imaging which showed negative  I agree with the radiologist interpretation Discussed radiation exposure and risks during pregnancy, patient expresses understanding and would like to proceed with x-ray.    Problem List / ED Course / Critical interventions / Medication management  Kelly Ibarra presents with flulike symptoms for 3 days.  On arrival she is hemodynamically  stable and well-appearing.  She is not febrile here.  Not hypoxic.  Her lungs are clear to auscultation bilaterally.  She does have some asthma and endorses some mild shortness of breath which improved after albuterol  inhaler. No distress. Also, she has no sign of acute otitis media or otitis externa on exam.  Does have some mild erythema of the posterior pharynx but uvula is midline, no sign of peritonsillar abscess, handling secretions normal phonation.  Chest x-ray shows no pneumonia.  History of present illness and labs most consistent with viral URI with cough.  She is [redacted] weeks pregnant but does not endorse any abdominal  pain or cramping.  No vaginal discharge or bleeding. I ordered medication including albuterol , tylenol   Reevaluation of the patient after these medicines showed that the patient improved I have reviewed the patients home medicines and have made adjustments as needed Patient hemodynamically stable and well-appearing throughout stay.  Feel appropriate for discharge with outpatient follow-up.  Discussed symptom management and return precautions.        Final diagnoses:  Viral URI with cough    ED Discharge Orders     None          Shermon Warren LOISE DEVONNA 03/29/24 1955    Kommor, Chauncey, MD 03/30/24 [phone]

## 2024-03-29 NOTE — ED Triage Notes (Signed)
 Pt c/o cough, fever and sore throat for a few days.

## 2024-03-31 ENCOUNTER — Telehealth: Payer: Self-pay | Admitting: Women's Health

## 2024-03-31 ENCOUNTER — Telehealth: Payer: Self-pay

## 2024-03-31 NOTE — Telephone Encounter (Signed)
 LVM with patient regarding COVID diagnosis and questions about treatment. Gave call back number as well as sent a Mychart message.

## 2024-03-31 NOTE — Telephone Encounter (Signed)
 OB patient tested positive for covid and would like to know what she can take for it. Please advise.

## 2024-04-02 ENCOUNTER — Other Ambulatory Visit: Payer: Self-pay | Admitting: Adult Health

## 2024-04-09 ENCOUNTER — Ambulatory Visit (INDEPENDENT_AMBULATORY_CARE_PROVIDER_SITE_OTHER): Admitting: Advanced Practice Midwife

## 2024-04-09 ENCOUNTER — Encounter: Attending: Advanced Practice Midwife | Admitting: Emergency Medicine

## 2024-04-09 ENCOUNTER — Encounter: Payer: Self-pay | Admitting: Advanced Practice Midwife

## 2024-04-09 VITALS — BP 107/74 | HR 66 | Wt 191.0 lb

## 2024-04-09 VITALS — BP 103/70 | HR 76 | Temp 97.9°F | Resp 18

## 2024-04-09 DIAGNOSIS — Z3A32 32 weeks gestation of pregnancy: Secondary | ICD-10-CM | POA: Diagnosis not present

## 2024-04-09 DIAGNOSIS — O99013 Anemia complicating pregnancy, third trimester: Secondary | ICD-10-CM

## 2024-04-09 DIAGNOSIS — D509 Iron deficiency anemia, unspecified: Secondary | ICD-10-CM

## 2024-04-09 DIAGNOSIS — Z3403 Encounter for supervision of normal first pregnancy, third trimester: Secondary | ICD-10-CM

## 2024-04-09 MED ORDER — ACETAMINOPHEN 325 MG PO TABS
650.0000 mg | ORAL_TABLET | Freq: Once | ORAL | Status: AC
  Administered 2024-04-09: 650 mg via ORAL

## 2024-04-09 MED ORDER — DIPHENHYDRAMINE HCL 25 MG PO CAPS
25.0000 mg | ORAL_CAPSULE | Freq: Once | ORAL | Status: AC
  Administered 2024-04-09: 25 mg via ORAL

## 2024-04-09 MED ORDER — IRON SUCROSE 500 MG IVPB - SIMPLE MED
500.0000 mg | Freq: Once | INTRAVENOUS | Status: AC
  Administered 2024-04-09: 500 mg via INTRAVENOUS
  Filled 2024-04-09: qty 400

## 2024-04-09 NOTE — Progress Notes (Addendum)
   LOW-RISK PREGNANCY VISIT Patient name: Kelly Ibarra MRN [id number]  Date of birth: [birth year] Chief Complaint:   Routine Prenatal Visit  History of Present Illness:   Kelly Ibarra is a 18 y.o. G1P0 female at [redacted]w[redacted]d with an Estimated Date of Delivery: 06/02/24 being seen today for ongoing management of a low-risk pregnancy. She tested + for covid on 03/29/24 after 3 days of flu like sx.  Today she reports backache. Contractions: Not present. Vag. Bleeding: None.  Movement: Present. denies leaking of fluid. Review of Systems:   Pertinent items are noted in HPI Denies abnormal vaginal discharge w/ itching/odor/irritation, headaches, visual changes, shortness of breath, chest pain, abdominal pain, severe nausea/vomiting, or problems with urination or bowel movements unless otherwise stated above. Pertinent History Reviewed:  Reviewed past medical,surgical, social, obstetrical and family history.  Reviewed problem list, medications and allergies. Physical Assessment:   Vitals:   04/09/24 0928  BP: 107/74  Pulse: 66  Weight: 191 lb (86.6 kg)  Body mass index is 30.83 kg/m.        Physical Examination:   General appearance: Well appearing, and in no distress  Mental status: Alert, oriented to person, place, and time  Skin: Warm & dry  Cardiovascular: Normal heart rate noted  Respiratory: Normal respiratory effort, no distress  Abdomen: Soft, gravid, nontender  Pelvic: Cervical exam deferred         Extremities: Edema: None Chaperone:  N/A   Fetal Status: Fetal Heart Rate (bpm): 148 Fundal Height: 32 cm Movement: Present      No results found for this or any previous visit (from the past 24 hours).  Assessment & Plan:    Pregnancy: G1P0 at [redacted]w[redacted]d 1. Anemia during pregnancy in third trimester (Primary) Had 1st iron  infusion 8/21, 2nd one scheduled today feels a little better  2. Encounter for supervision of normal first pregnancy in third trimester   3.  10 days S/P Covid sx:   asymptomatic and feels well    Meds: No orders of the defined types were placed in this encounter.  Labs/procedures today: none  Plan:  Continue routine obstetrical care  Next visit: prefers in person    Reviewed: Preterm labor symptoms and general obstetric precautions including but not limited to vaginal bleeding, contractions, leaking of fluid and fetal movement were reviewed in detail with the patient.  All questions were answered. Has home bp cuff.. Check bp weekly, let us  know if >140/90.   Follow-up: Return in about 2 weeks (around 04/23/2024) for LROB.  Future Appointments  Date Time Provider Department Center  04/10/2024 10:45 AM Iva Marty Saltness, MD [facility] None  04/22/2024  9:10 AM Loreli Suzen BIRCH, CNM [facility]    No orders of the defined types were placed in this encounter.  Cathlean Ely DNP, CNM 04/09/2024 9:59 AM

## 2024-04-09 NOTE — Patient Instructions (Addendum)
Kinesiology taping for pregnancy:  Youtube has good vidoes of "how tos" for lower back, pelvic, hip pain; swelling of feet, etc  

## 2024-04-09 NOTE — Progress Notes (Signed)
 Diagnosis: Iron  Deficiency Anemia  Provider:  Suzen Gentry  Procedure: IV Infusion  IV Type: Peripheral, IV Location: L Antecubital  Venofer  (Iron  Sucrose), Dose: 500 mg  Infusion Start Time: 1038  Infusion Stop Time: 1525  Post Infusion IV Care: Observation period completed and Peripheral IV Discontinued  Discharge: Condition: Good, Destination: Home . AVS Provided  Performed by:  Delon ONEIDA Officer, RN

## 2024-04-10 ENCOUNTER — Ambulatory Visit: Admitting: Allergy & Immunology

## 2024-04-22 ENCOUNTER — Ambulatory Visit (INDEPENDENT_AMBULATORY_CARE_PROVIDER_SITE_OTHER): Admitting: Advanced Practice Midwife

## 2024-04-22 VITALS — BP 109/69 | HR 87 | Wt 194.0 lb

## 2024-04-22 DIAGNOSIS — O99013 Anemia complicating pregnancy, third trimester: Secondary | ICD-10-CM | POA: Diagnosis not present

## 2024-04-22 DIAGNOSIS — Z23 Encounter for immunization: Secondary | ICD-10-CM

## 2024-04-22 DIAGNOSIS — Z3403 Encounter for supervision of normal first pregnancy, third trimester: Secondary | ICD-10-CM

## 2024-04-22 DIAGNOSIS — Z3A34 34 weeks gestation of pregnancy: Secondary | ICD-10-CM

## 2024-04-22 NOTE — Progress Notes (Signed)
   LOW-RISK PREGNANCY VISIT Patient name: Kelly Ibarra MRN [id number]  Date of birth: 06/04/2006 Chief Complaint:   Routine Prenatal Visit  History of Present Illness:   Kelly Ibarra is a 18 y.o. G1P0 female at [redacted]w[redacted]d with an Estimated Date of Delivery: 06/02/24 being seen today for ongoing management of a low-risk pregnancy.  Today she reports doing well. Contractions: Not present. Vag. Bleeding: None.  Movement: Present. denies leaking of fluid.  Review of Systems:   Pertinent items are noted in HPI Denies abnormal vaginal discharge w/ itching/odor/irritation, headaches, visual changes, shortness of breath, chest pain, abdominal pain, severe nausea/vomiting, or problems with urination or bowel movements unless otherwise stated above. Pertinent History Reviewed:  Reviewed past medical,surgical, social, obstetrical and family history.  Reviewed problem list, medications and allergies. Physical Assessment:   Vitals:   04/22/24 0934  BP: 109/69  Pulse: 87  Weight: 194 lb (88 kg)  Body mass index is 31.31 kg/m.        Physical Examination:   General appearance: Well appearing, and in no distress  Mental status: Alert, oriented to person, place, and time  Skin: Warm & dry  Cardiovascular: Normal heart rate noted  Respiratory: Normal respiratory effort, no distress  Abdomen: Soft, gravid, nontender  Pelvic: Cervical exam deferred         Extremities:    Fetal Status: Fetal Heart Rate (bpm): 154 Fundal Height: 35 cm Movement: Present    No results found for this or any previous visit (from the past 24 hours).  Assessment & Plan:  1) Low-risk pregnancy G1P0 at [redacted]w[redacted]d with an Estimated Date of Delivery: 06/02/24   2) Anemia, Hgb 8.1 on 8/26, and is now s/p Venofer  x 2; CBC today; isn't taking oral Fe> issue rev'd   Meds: No orders of the defined types were placed in this encounter.  Labs/procedures today: Tdap; CBC  Plan:  Continue routine obstetrical care   Reviewed: Preterm  labor symptoms and general obstetric precautions including but not limited to vaginal bleeding, contractions, leaking of fluid and fetal movement were reviewed in detail with the patient.  All questions were answered. Has home bp cuff. Check bp weekly, let us  know if >140/90.   Follow-up: Return in about 2 weeks (around 05/06/2024) for LROB, in person (GBS/cultures).  Orders Placed This Encounter  Procedures   CBC   Suzen JONETTA Gentry [facility] 04/22/2024 9:52 AM

## 2024-04-24 LAB — CBC
Hematocrit: 31 % — ABNORMAL LOW (ref 34.0–46.6)
Hemoglobin: 9.7 g/dL — ABNORMAL LOW (ref 11.1–15.9)
MCH: 25.7 pg — ABNORMAL LOW (ref 26.6–33.0)
MCHC: 31.3 g/dL — ABNORMAL LOW (ref 31.5–35.7)
MCV: 82 fL (ref 79–97)
Platelets: 170 x10E3/uL (ref 150–450)
RBC: 3.78 x10E6/uL (ref 3.77–5.28)
RDW: 20.5 % — ABNORMAL HIGH (ref 11.7–15.4)
WBC: 6.3 x10E3/uL (ref 3.4–10.8)

## 2024-04-25 ENCOUNTER — Ambulatory Visit: Payer: Self-pay | Admitting: Advanced Practice Midwife

## 2024-05-05 ENCOUNTER — Encounter (HOSPITAL_COMMUNITY): Payer: Self-pay

## 2024-05-05 ENCOUNTER — Other Ambulatory Visit: Payer: Self-pay | Admitting: Adult Health

## 2024-05-05 ENCOUNTER — Other Ambulatory Visit: Payer: Self-pay

## 2024-05-05 ENCOUNTER — Emergency Department (HOSPITAL_COMMUNITY)
Admission: EM | Admit: 2024-05-05 | Discharge: 2024-05-05 | Disposition: A | Discharge status: Home / Self Care | Attending: Emergency Medicine | Admitting: Emergency Medicine

## 2024-05-05 DIAGNOSIS — J45909 Unspecified asthma, uncomplicated: Secondary | ICD-10-CM | POA: Insufficient documentation

## 2024-05-05 DIAGNOSIS — Z7982 Long term (current) use of aspirin: Secondary | ICD-10-CM | POA: Diagnosis not present

## 2024-05-05 DIAGNOSIS — Z7951 Long term (current) use of inhaled steroids: Secondary | ICD-10-CM | POA: Insufficient documentation

## 2024-05-05 DIAGNOSIS — O2693 Pregnancy related conditions, unspecified, third trimester: Secondary | ICD-10-CM | POA: Insufficient documentation

## 2024-05-05 DIAGNOSIS — O1203 Gestational edema, third trimester: Secondary | ICD-10-CM | POA: Insufficient documentation

## 2024-05-05 DIAGNOSIS — R140 Abdominal distension (gaseous): Secondary | ICD-10-CM | POA: Insufficient documentation

## 2024-05-05 DIAGNOSIS — O99513 Diseases of the respiratory system complicating pregnancy, third trimester: Secondary | ICD-10-CM | POA: Diagnosis not present

## 2024-05-05 DIAGNOSIS — Z3A36 36 weeks gestation of pregnancy: Secondary | ICD-10-CM | POA: Diagnosis not present

## 2024-05-05 NOTE — ED Triage Notes (Signed)
 Complaining of leg swelling and hurting to walk for the last 3 days. Pt is [redacted] weeks pregnant.

## 2024-05-05 NOTE — ED Provider Notes (Signed)
 [place] EMERGENCY DEPARTMENT AT [facility] Provider Note   CSN: [id number] Arrival date & time: 05/05/24  2136     Patient presents with: Leg Pain   Kelly Ibarra is a 18 y.o. female.    Leg Pain Patient presents for bilateral leg swelling.  Medical history includes asthma, anxiety, depression.  She is G1, P0.  She is currently [redacted] weeks pregnant.  Her pregnancy has been uncomplicated.  She has an OB/GYN appointment tomorrow.  For the past few days, she has noticed bilateral leg swelling.  She has discomfort in her hips and in her feet.  Swelling is worsened after prolonged standing and ambulation.  She denies any other recent symptoms.     Prior to Admission medications   Medication Sig Start Date End Date Taking? Authorizing Provider  albuterol  (VENTOLIN  HFA) 108 (90 Base) MCG/ACT inhaler Inhale 2 puffs into the lungs every 6 (six) hours as needed for wheezing or shortness of breath. 03/06/23   Iva Marty Saltness, MD  aspirin  EC 81 MG tablet Take 1 tablet (81 mg total) by mouth daily. Swallow whole. Patient not taking: Reported on 04/22/2024 11/20/23   Delores Nidia CROME, FNP  Budesonide  (PULMICORT  FLEXHALER) 90 MCG/ACT inhaler Inhale 2 puffs into the lungs daily. 01/24/24   Cari Arlean HERO, FNP  budesonide  (RHINOCORT  AQUA) 32 MCG/ACT nasal spray Place 2 sprays into both nostrils daily. 01/24/24   Cari Arlean HERO, FNP  cetirizine  (ZYRTEC ) 10 MG tablet Take 1 tablet (10 mg total) by mouth daily. 10/25/23   Iva Marty Saltness, MD  Emollient (CERAVE MOISTURIZING) CREA Apply 1 Application topically daily. 09/07/14   [provider]  EPINEPHrine 0.3 mg/0.3 mL IJ SOAJ injection Inject 0.3 mg into the muscle as needed for anaphylaxis. Patient not taking: Reported on 04/22/2024 01/11/23   [provider]  Ferrous Fumarate  (HEMOCYTE - 106 MG FE) 324 (106 Fe) MG TABS tablet Take 1 tablet (106 mg of iron  total) by mouth every other day. 03/18/24   Loreli Iha D, CNM   ondansetron  (ZOFRAN -ODT) 4 MG disintegrating tablet DISSOLVE 1 TABLET IN MOUTH EVERY 8 HOURS AS NEEDED FOR NAUSEA AND VOMITING 04/02/24   Signa Delon DELENA, NP  Prenatal Vit-Fe Fumarate-FA (PRENATAL COMPLETE) 14-0.4 MG TABS Take 1 tablet by mouth daily. 10/08/23   Cooleen, Olam DELENA, NP  Soap & Cleansers (CERAVE SA BODY WASH EX) Apply 1 Application topically daily. Patient not taking: Reported on 04/22/2024 09/07/14   [provider]  Spacer/Aero-Holding Raguel FRENCH Use spacer as directed with inhaler. 06/07/23   Cari Arlean HERO, FNP    Allergies: Alpha-gal    Review of Systems  Cardiovascular:  Positive for leg swelling.  All other systems reviewed and are negative.   Updated Vital Signs BP 129/76   Pulse 86   Temp (!) 97.5 F (36.4 C) (Temporal)   Resp 17   Ht 5' 6 (1.676 m)   Wt 86.2 kg   LMP 08/27/2023   SpO2 100%   BMI 30.67 kg/m   Physical Exam Vitals and nursing note reviewed.  Constitutional:      General: She is not in acute distress.    Appearance: Normal appearance. She is well-developed. She is not ill-appearing, toxic-appearing or diaphoretic.  HENT:     Head: Normocephalic and atraumatic.     Right Ear: External ear normal.     Left Ear: External ear normal.     Nose: Nose normal.     Mouth/Throat:  Mouth: Mucous membranes are moist.  Eyes:     Extraocular Movements: Extraocular movements intact.     Conjunctiva/sclera: Conjunctivae normal.  Cardiovascular:     Rate and Rhythm: Normal rate and regular rhythm.  Pulmonary:     Effort: Pulmonary effort is normal. No respiratory distress.  Abdominal:     General: There is distension.  Musculoskeletal:        General: No swelling. Normal range of motion.     Cervical back: Normal range of motion and neck supple.     Right lower leg: Edema present.     Left lower leg: Edema present.  Skin:    General: Skin is warm and dry.     Coloration: Skin is not jaundiced or pale.  Neurological:     General:  No focal deficit present.     Mental Status: She is alert and oriented to person, place, and time.     Cranial Nerves: No cranial nerve deficit.     Sensory: No sensory deficit.     Motor: No weakness.     Coordination: Coordination normal.  Psychiatric:        Mood and Affect: Mood normal.        Behavior: Behavior normal.     (all labs ordered are listed, but only abnormal results are displayed) Labs Reviewed - No data to display  EKG: None  Radiology: No results found.   Procedures   Medications Ordered in the ED - No data to display                                  Medical Decision Making  Patient presents for bilateral leg swelling.  She is [redacted] weeks pregnant.  Swelling has been present over the past several days.  It is worse with prolonged standing and ambulation.  She has discomfort in her hips and in her feet.  On exam, she is well-appearing.  She has appropriately gravid uterus.  Leg swelling is symmetrical.  It is without erythema or warmth.  There are no significant areas of tenderness.  I do not suspect DVT.  Patient had normal blood pressure while in the ED.  She has not had any other symptoms.  I do not suspect preeclampsia.  Patient was advised to elevate legs when possible and wear stockings to minimize swelling.  She does have an OB/GYN appointment tomorrow.  She is stable for discharge.     Final diagnoses:  Swelling of lower extremity during pregnancy in third trimester    ED Discharge Orders     None          Melvenia Motto, MD 05/05/24 2244

## 2024-05-05 NOTE — Discharge Instructions (Signed)
 Elevate legs when possible.  Wear compressive socks when you are on your feet for prolonged time.  Follow-up with scheduled with your OB/GYN.  Return to the emergency department for any new or worsening symptoms of concern.

## 2024-05-06 ENCOUNTER — Ambulatory Visit (INDEPENDENT_AMBULATORY_CARE_PROVIDER_SITE_OTHER): Admitting: Advanced Practice Midwife

## 2024-05-06 ENCOUNTER — Other Ambulatory Visit (HOSPITAL_COMMUNITY)
Admission: RE | Admit: 2024-05-06 | Discharge: 2024-05-06 | Disposition: A | Discharge status: Home / Self Care | Source: Ambulatory Visit | Attending: Advanced Practice Midwife | Admitting: Advanced Practice Midwife

## 2024-05-06 VITALS — BP 117/71 | HR 103 | Wt 212.0 lb

## 2024-05-06 DIAGNOSIS — Z3A36 36 weeks gestation of pregnancy: Secondary | ICD-10-CM | POA: Insufficient documentation

## 2024-05-06 DIAGNOSIS — Z3403 Encounter for supervision of normal first pregnancy, third trimester: Secondary | ICD-10-CM

## 2024-05-06 DIAGNOSIS — Z3493 Encounter for supervision of normal pregnancy, unspecified, third trimester: Secondary | ICD-10-CM | POA: Diagnosis present

## 2024-05-06 NOTE — Progress Notes (Signed)
   LOW-RISK PREGNANCY VISIT Patient name: Kelly Ibarra MRN [id number]  Date of birth: 11-07-2005 Chief Complaint:   Routine Prenatal Visit (culture)  History of Present Illness:   Kelly Ibarra is a 18 y.o. G1P0 female at [redacted]w[redacted]d with an Estimated Date of Delivery: 06/02/24 being seen today for ongoing management of a low-risk pregnancy.  Today she reports no complaints. Contractions: Not present.  .  Movement: Present. denies leaking of fluid. Review of Systems:   Pertinent items are noted in HPI Denies abnormal vaginal discharge w/ itching/odor/irritation, headaches, visual changes, shortness of breath, chest pain, abdominal pain, severe nausea/vomiting, or problems with urination or bowel movements unless otherwise stated above. Pertinent History Reviewed:  Reviewed past medical,surgical, social, obstetrical and family history.  Reviewed problem list, medications and allergies. Physical Assessment:   Vitals:   05/06/24 0948  BP: 117/71  Pulse: (!) 103  Weight: 212 lb (96.2 kg)  Body mass index is 34.22 kg/m.        Physical Examination:   General appearance: Well appearing, and in no distress  Mental status: Alert, oriented to person, place, and time  Skin: Warm & dry  Cardiovascular: Normal heart rate noted  Respiratory: Normal respiratory effort, no distress  Abdomen: Soft, gravid, nontender  Pelvic: Cervical exam performed  Dilation: Closed Effacement (%): Thick Station: -3  Extremities: Edema: Trace  Fetal Status: Fetal Heart Rate (bpm): 145 Fundal Height: 37 cm Movement: Present Presentation: Vertex  No results found for this or any previous visit (from the past 24 hours).  Assessment & Plan:  1) Low-risk pregnancy G1P0 at [redacted]w[redacted]d with an Estimated Date of Delivery: 06/02/24   2) Anemia, Hgb 9.7 s/p IV Fe x 2; continue oral Fe   Meds: No orders of the defined types were placed in this encounter.  Labs/procedures today: GBS/cultures/SVD  Plan:  Continue routine  obstetrical care   Reviewed: Term labor symptoms and general obstetric precautions including but not limited to vaginal bleeding, contractions, leaking of fluid and fetal movement were reviewed in detail with the patient.  All questions were answered. Has home bp cuff. Check bp weekly, let us  know if >140/90.   Follow-up: Return for weekly LROB x 3.  Orders Placed This Encounter  Procedures   Culture, beta strep (group b only)   Suzen JONETTA Gentry CNM 05/06/2024 10:20 AM

## 2024-05-07 LAB — CERVICOVAGINAL ANCILLARY ONLY
Chlamydia: NEGATIVE
Comment: NEGATIVE
Comment: NORMAL
Neisseria Gonorrhea: NEGATIVE

## 2024-05-10 LAB — CULTURE, BETA STREP (GROUP B ONLY)

## 2024-05-14 ENCOUNTER — Encounter: Payer: Self-pay | Admitting: Women's Health

## 2024-05-14 ENCOUNTER — Ambulatory Visit (INDEPENDENT_AMBULATORY_CARE_PROVIDER_SITE_OTHER): Admitting: Women's Health

## 2024-05-14 VITALS — BP 127/82 | HR 99 | Wt 207.0 lb

## 2024-05-14 DIAGNOSIS — O99013 Anemia complicating pregnancy, third trimester: Secondary | ICD-10-CM

## 2024-05-14 DIAGNOSIS — Z3A37 37 weeks gestation of pregnancy: Secondary | ICD-10-CM | POA: Diagnosis not present

## 2024-05-14 DIAGNOSIS — Z3403 Encounter for supervision of normal first pregnancy, third trimester: Secondary | ICD-10-CM

## 2024-05-14 NOTE — Patient Instructions (Signed)
 Kelly Ibarra, thank you for choosing our office today! We appreciate the opportunity to meet your healthcare needs. You may receive a short survey by mail, e-mail, or through [employer]. If you are happy with your care we would appreciate if you could take just a few minutes to complete the survey questions. We read all of your comments and take your feedback very seriously. Thank you again for choosing our office.  Center for [employer] Team at [facility]  [facility] at [facility] ([address] [place], [place] [address]) Entrance C, located off of [address] Free 24/7 valet parking   CLASSES: Go to [email or web address] to register for classes (childbirth, breastfeeding, waterbirth, infant CPR, daddy bootcamp, etc.)  Call the office [phone]) or go to [facility] if: You begin to have strong, frequent contractions Your water breaks.  Sometimes it is a big gush of fluid, sometimes it is just a trickle that keeps getting your panties wet or running down your legs You have vaginal bleeding.  It is normal to have a small amount of spotting if your cervix was checked.  You don't feel your baby moving like normal.  If you don't, get you something to eat and drink and lay down and focus on feeling your baby move.   If your baby is still not moving like normal, you should call the office or go to [facility].  Call the office [phone]) or go to [facility] hospital for these signs of pre-eclampsia: Severe headache that does not go away with Tylenol  Visual changes- seeing spots, double, blurred vision Pain under your right breast or upper abdomen that does not go away with Tums or heartburn medicine Nausea and/or vomiting Severe swelling in your hands, feet, and face   [place] Pediatricians/Family Doctors [facility]): [address] Luba BROCKS, [phone]           [facility]: [address], [phone]                 [facility]): [address], [phone] (call to ask if accepting patients) [facility]: [address] [place], [phone]    [facility] Pediatricians/Family Doctors [facility]): [address], [phone] [facility]: [address] [place], [phone] [facility]: [address], [phone]  [facility] Doctors  [facility]): [phone] [facility]: [address] [phone]   [facility] Doctors [facility]: [address] [phone]  [facility] Doctors  [facility]: [phone], [phone]  Home Blood Pressure Monitoring for Patients   Your provider has recommended that you check your blood pressure (BP) at least once a week at home. If you do not have a blood pressure cuff at home, one will be provided for you. Contact your provider if you have not received your monitor within 1 week.   Helpful Tips for Accurate Home Blood Pressure Checks  Don't smoke, exercise, or drink caffeine 30 minutes before checking your BP Use the restroom before checking your BP (a full bladder can raise your pressure) Relax in a comfortable upright chair Feet on the ground Left arm resting comfortably on a flat surface at the level of your heart Legs uncrossed Back supported Sit quietly and don't talk Place the cuff on your bare arm Adjust snuggly, so that only two fingertips  can fit between your skin and the top of the cuff Check 2 readings separated by at least one minute Keep a log of your BP readings For a visual, please reference this diagram: http://ccnc.care/bpdiagram  Provider Name: [facility]     Phone: [phone]  Zone 1: ALL CLEAR  Continue to monitor your symptoms:  BP reading is less than 140 (top number) or less than 90 (bottom number)  No right  upper stomach pain No headaches or seeing spots No feeling nauseated or throwing up No swelling in face and hands  Zone 2: CAUTION Call your doctor's office for any of the following:  BP reading is greater than 140 (top number) or greater than 90 (bottom number)  Stomach pain under your ribs in the middle or right side Headaches or seeing spots Feeling nauseated or throwing up Swelling in face and hands  Zone 3: EMERGENCY  Seek immediate medical care if you have any of the following:  BP reading is greater than160 (top number) or greater than 110 (bottom number) Severe headaches not improving with Tylenol  Serious difficulty catching your breath Any worsening symptoms from Zone 2   Braxton Hicks Contractions Contractions of the uterus can occur throughout pregnancy, but they are not always a sign that you are in labor. You may have practice contractions called Braxton Hicks contractions. These false labor contractions are sometimes confused with true labor. What are Darol Irving contractions? Braxton Hicks contractions are tightening movements that occur in the muscles of the uterus before labor. Unlike true labor contractions, these contractions do not result in opening (dilation) and thinning of the cervix. Toward the end of pregnancy (32-34 weeks), Braxton Hicks contractions can happen more often and may become stronger. These contractions are sometimes difficult to tell apart from true labor because they can be very uncomfortable. You should not feel embarrassed if you go to the hospital with false labor. Sometimes, the only way to tell if you are in true labor is for your health care provider to look for changes in the cervix. The health care provider will do a physical exam and may monitor your contractions. If you are not in true labor, the exam should show that your cervix is not dilating and your water has not broken. If there are no other health problems associated with your  pregnancy, it is completely safe for you to be sent home with false labor. You may continue to have Braxton Hicks contractions until you go into true labor. How to tell the difference between true labor and false labor True labor Contractions last 30-70 seconds. Contractions become very regular. Discomfort is usually felt in the top of the uterus, and it spreads to the lower abdomen and low back. Contractions do not go away with walking. Contractions usually become more intense and increase in frequency. The cervix dilates and gets thinner. False labor Contractions are usually shorter and not as strong as true labor contractions. Contractions are usually irregular. Contractions are often felt in the front of the lower abdomen and in the groin. Contractions may go away when you walk around or change positions while lying down. Contractions get weaker and are shorter-lasting as time goes on. The cervix usually does not dilate or become thin. Follow these instructions at home:  Take over-the-counter and prescription medicines only as told by your health care provider. Keep up with your usual exercises and follow other instructions from your health care provider. Eat and drink lightly if you think  you are going into labor. If Braxton Hicks contractions are making you uncomfortable: Change your position from lying down or resting to walking, or change from walking to resting. Sit and rest in a tub of warm water. Drink enough fluid to keep your urine pale yellow. Dehydration may cause these contractions. Do slow and deep breathing several times an hour. Keep all follow-up prenatal visits as told by your health care provider. This is important. Contact a health care provider if: You have a fever. You have continuous pain in your abdomen. Get help right away if: Your contractions become stronger, more regular, and closer together. You have fluid leaking or gushing from your vagina. You pass  blood-tinged mucus (bloody show). You have bleeding from your vagina. You have low back pain that you never had before. You feel your baby's head pushing down and causing pelvic pressure. Your baby is not moving inside you as much as it used to. Summary Contractions that occur before labor are called Braxton Hicks contractions, false labor, or practice contractions. Braxton Hicks contractions are usually shorter, weaker, farther apart, and less regular than true labor contractions. True labor contractions usually become progressively stronger and regular, and they become more frequent. Manage discomfort from [facility] contractions by changing position, resting in a warm bath, drinking plenty of water, or practicing deep breathing. This information is not intended to replace advice given to you by your health care provider. Make sure you discuss any questions you have with your health care provider. Document Revised: 07/05/2017 Document Reviewed: 12/06/2016 Elsevier Patient Education  2020 [employer].

## 2024-05-14 NOTE — Progress Notes (Signed)
 LOW-RISK PREGNANCY VISIT Patient name: Kelly Ibarra MRN [id number]  Date of birth: 22-Mar-2006 Chief Complaint:   Routine Prenatal Visit  History of Present Illness:   Kelly Ibarra is a 18 y.o. G1P0 female at [redacted]w[redacted]d with an Estimated Date of Delivery: 06/02/24 being seen today for ongoing management of a low-risk pregnancy.   Today she reports no complaints. Contractions: Not present.  .  Movement: Present. denies leaking of fluid.     04/09/2024   10:03 AM 03/26/2024    9:14 AM 11/20/2023   10:17 AM 10/09/2023   10:25 AM 05/17/2022   11:13 AM  Depression screen PHQ 2/9  Decreased Interest 0 0 2 2   Down, Depressed, Hopeless 0 0 1 1   PHQ - 2 Score 0 0 3 3   Altered sleeping   3 3   Tired, decreased energy   1 3   Change in appetite   2 3   Feeling bad or failure about yourself    0 1   Trouble concentrating   0 1   Moving slowly or fidgety/restless   0 0   Suicidal thoughts   0 0   PHQ-9 Score   9 14      Information is confidential and restricted. Go to Review Flowsheets to unlock data.        11/20/2023   10:17 AM 10/09/2023   10:26 AM 01/30/2022   10:00 AM 09/11/2021    3:11 PM  GAD 7 : Generalized Anxiety Score  Nervous, Anxious, on Edge 2 2    Control/stop worrying 2 1    Worry too much - different things 1 0    Trouble relaxing 1 1    Restless 0 0    Easily annoyed or irritable 2 2    Afraid - awful might happen 1 2    Total GAD 7 Score 9 8    Anxiety Difficulty         Information is confidential and restricted. Go to Review Flowsheets to unlock data.      Review of Systems:   Pertinent items are noted in HPI Denies abnormal vaginal discharge w/ itching/odor/irritation, headaches, visual changes, shortness of breath, chest pain, abdominal pain, severe nausea/vomiting, or problems with urination or bowel movements unless otherwise stated above. Pertinent History Reviewed:  Reviewed past medical,surgical, social, obstetrical and family history.  Reviewed problem  list, medications and allergies. Physical Assessment:   Vitals:   05/14/24 0958  BP: 127/82  Pulse: 99  Weight: 207 lb (93.9 kg)  Body mass index is 33.41 kg/m.        Physical Examination:   General appearance: Well appearing, and in no distress  Mental status: Alert, oriented to person, place, and time  Skin: Warm & dry  Cardiovascular: Normal heart rate noted  Respiratory: Normal respiratory effort, no distress  Abdomen: Soft, gravid, nontender  Pelvic: Cervical exam performed  Dilation: 1 Effacement (%): Thick Station: Ballotable  Extremities: Edema: Trace  Fetal Status: Fetal Heart Rate (bpm): 147 Fundal Height: 37 cm Movement: Present Presentation: Vertex  Chaperone: Peggy Dones No results found for this or any previous visit (from the past 24 hours).  Assessment & Plan:  1) Low-risk pregnancy G1P0 at [redacted]w[redacted]d with an Estimated Date of Delivery: 06/02/24   2) Anemia, s/p IV Fe, last check 9.7 (up from 8.1), continue po Fe   Meds: No orders of the defined types were placed in this encounter.  Labs/procedures today: SVE  Plan:  Continue routine obstetrical care  Next visit: prefers in person    Reviewed: Term labor symptoms and general obstetric precautions including but not limited to vaginal bleeding, contractions, leaking of fluid and fetal movement were reviewed in detail with the patient.  All questions were answered. Does have home bp cuff. Office bp cuff given: not applicable. Check bp weekly, let us  know if consistently >140 and/or >90.  Follow-up: Return for As scheduled.  Future Appointments  Date Time Provider Department Center  05/20/2024 10:50 AM Loreli Suzen BIRCH, CNM [facility]  05/27/2024  9:10 AM Kizzie Suzen SAUNDERS, CNM [facility]    No orders of the defined types were placed in this encounter.  Suzen SAUNDERS Kizzie CNM, [facility] 05/14/2024 10:11 AM

## 2024-05-18 ENCOUNTER — Inpatient Hospital Stay (HOSPITAL_COMMUNITY)
Admission: AD | Admit: 2024-05-18 | Discharge: 2024-05-21 | DRG: 788 | Disposition: A | Discharge status: Home / Self Care | Attending: Obstetrics & Gynecology | Admitting: Obstetrics & Gynecology

## 2024-05-18 ENCOUNTER — Encounter (HOSPITAL_COMMUNITY): Payer: Self-pay | Admitting: Obstetrics & Gynecology

## 2024-05-18 DIAGNOSIS — Z3A38 38 weeks gestation of pregnancy: Secondary | ICD-10-CM | POA: Diagnosis not present

## 2024-05-18 DIAGNOSIS — Z7951 Long term (current) use of inhaled steroids: Secondary | ICD-10-CM | POA: Diagnosis not present

## 2024-05-18 DIAGNOSIS — O9902 Anemia complicating childbirth: Secondary | ICD-10-CM | POA: Diagnosis present

## 2024-05-18 DIAGNOSIS — Z833 Family history of diabetes mellitus: Secondary | ICD-10-CM | POA: Diagnosis not present

## 2024-05-18 DIAGNOSIS — O321XX0 Maternal care for breech presentation, not applicable or unspecified: Secondary | ICD-10-CM | POA: Diagnosis present

## 2024-05-18 DIAGNOSIS — Z3403 Encounter for supervision of normal first pregnancy, third trimester: Principal | ICD-10-CM

## 2024-05-18 DIAGNOSIS — Z7982 Long term (current) use of aspirin: Secondary | ICD-10-CM

## 2024-05-18 DIAGNOSIS — O4290 Premature rupture of membranes, unspecified as to length of time between rupture and onset of labor, unspecified weeks of gestation: Secondary | ICD-10-CM | POA: Diagnosis present

## 2024-05-18 DIAGNOSIS — Z98891 History of uterine scar from previous surgery: Secondary | ICD-10-CM

## 2024-05-18 DIAGNOSIS — Z3490 Encounter for supervision of normal pregnancy, unspecified, unspecified trimester: Secondary | ICD-10-CM

## 2024-05-18 DIAGNOSIS — O4292 Full-term premature rupture of membranes, unspecified as to length of time between rupture and onset of labor: Secondary | ICD-10-CM | POA: Diagnosis present

## 2024-05-18 DIAGNOSIS — O99214 Obesity complicating childbirth: Secondary | ICD-10-CM | POA: Diagnosis present

## 2024-05-18 DIAGNOSIS — O99013 Anemia complicating pregnancy, third trimester: Secondary | ICD-10-CM

## 2024-05-18 LAB — CBC
HCT: 31.1 % — ABNORMAL LOW (ref 36.0–46.0)
Hemoglobin: 10.1 g/dL — ABNORMAL LOW (ref 12.0–15.0)
MCH: 25.6 pg — ABNORMAL LOW (ref 26.0–34.0)
MCHC: 32.5 g/dL (ref 30.0–36.0)
MCV: 78.9 fL — ABNORMAL LOW (ref 80.0–100.0)
Platelets: 239 K/uL (ref 150–400)
RBC: 3.94 MIL/uL (ref 3.87–5.11)
RDW: 20.5 % — ABNORMAL HIGH (ref 11.5–15.5)
WBC: 6.4 K/uL (ref 4.0–10.5)
nRBC: 0 % (ref 0.0–0.2)

## 2024-05-18 LAB — POCT FERN TEST: POCT Fern Test: POSITIVE

## 2024-05-18 MED ORDER — OXYCODONE-ACETAMINOPHEN 5-325 MG PO TABS: 1.0000 | ORAL_TABLET | ORAL | Status: DC | PRN

## 2024-05-18 MED ORDER — ACETAMINOPHEN 325 MG PO TABS: 650.0000 mg | ORAL_TABLET | ORAL | Status: DC | PRN

## 2024-05-18 MED ORDER — OXYTOCIN BOLUS FROM INFUSION: 333.0000 mL | Freq: Once | INTRAVENOUS | Status: DC

## 2024-05-18 MED ORDER — LACTATED RINGERS IV SOLN: INTRAVENOUS | Status: DC

## 2024-05-18 MED ORDER — OXYCODONE-ACETAMINOPHEN 5-325 MG PO TABS: 2.0000 | ORAL_TABLET | ORAL | Status: DC | PRN

## 2024-05-18 MED ORDER — SOD CITRATE-CITRIC ACID 500-334 MG/5ML PO SOLN: 30.0000 mL | ORAL | Status: DC | PRN

## 2024-05-18 MED ORDER — OXYTOCIN-SODIUM CHLORIDE 30-0.9 UT/500ML-% IV SOLN: 2.5000 [IU]/h | INTRAVENOUS | Status: DC

## 2024-05-18 MED ORDER — FENTANYL CITRATE (PF) 100 MCG/2ML IJ SOLN: 50.0000 ug | INTRAMUSCULAR | Status: DC | PRN

## 2024-05-18 MED ORDER — ONDANSETRON HCL 4 MG/2ML IJ SOLN: 4.0000 mg | Freq: Four times a day (QID) | INTRAMUSCULAR | Status: DC | PRN

## 2024-05-18 MED ORDER — LIDOCAINE HCL (PF) 1 % IJ SOLN: 30.0000 mL | INTRAMUSCULAR | Status: DC | PRN

## 2024-05-18 MED ORDER — ALBUTEROL SULFATE (2.5 MG/3ML) 0.083% IN NEBU
3.0000 mL | INHALATION_SOLUTION | Freq: Four times a day (QID) | RESPIRATORY_TRACT | Status: DC | PRN
Start: 2024-05-18 — End: 2024-05-19

## 2024-05-18 MED ORDER — LACTATED RINGERS IV SOLN
500.0000 mL | INTRAVENOUS | Status: DC | PRN
  Administered 2024-05-19: 1000 mL via INTRAVENOUS

## 2024-05-18 NOTE — MAU Note (Signed)
 Pt says - Sun am at 0100- felt fluid come out and has continued - clear.  Has cramping- 3/10 and back ache.  [facility] - on 05-14-2024 VE 1 cm Denies HSV GBS- neg  Last sex- 1 mth ago

## 2024-05-19 ENCOUNTER — Encounter (HOSPITAL_COMMUNITY)
Admission: AD | Disposition: A | Discharge status: Home / Self Care | Payer: Self-pay | Source: Home / Self Care | Attending: Obstetrics & Gynecology

## 2024-05-19 ENCOUNTER — Encounter (HOSPITAL_COMMUNITY): Payer: Self-pay | Admitting: Obstetrics & Gynecology

## 2024-05-19 ENCOUNTER — Inpatient Hospital Stay (HOSPITAL_COMMUNITY): Admitting: Anesthesiology

## 2024-05-19 ENCOUNTER — Other Ambulatory Visit: Payer: Self-pay

## 2024-05-19 DIAGNOSIS — O321XX0 Maternal care for breech presentation, not applicable or unspecified: Secondary | ICD-10-CM

## 2024-05-19 DIAGNOSIS — Z3A38 38 weeks gestation of pregnancy: Secondary | ICD-10-CM

## 2024-05-19 DIAGNOSIS — O4290 Premature rupture of membranes, unspecified as to length of time between rupture and onset of labor, unspecified weeks of gestation: Secondary | ICD-10-CM | POA: Diagnosis present

## 2024-05-19 DIAGNOSIS — Z98891 History of uterine scar from previous surgery: Secondary | ICD-10-CM

## 2024-05-19 LAB — COMPREHENSIVE METABOLIC PANEL WITH GFR
ALT: 17 U/L (ref 0–44)
AST: 26 U/L (ref 15–41)
Albumin: 2.3 g/dL — ABNORMAL LOW (ref 3.5–5.0)
Alkaline Phosphatase: 271 U/L — ABNORMAL HIGH (ref 38–126)
Anion gap: 9 (ref 5–15)
BUN: 6 mg/dL (ref 6–20)
CO2: 19 mmol/L — ABNORMAL LOW (ref 22–32)
Calcium: 8 mg/dL — ABNORMAL LOW (ref 8.9–10.3)
Chloride: 106 mmol/L (ref 98–111)
Creatinine, Ser: 0.8 mg/dL (ref 0.44–1.00)
GFR, Estimated: >60 mL/min (ref >60)
Glucose, Bld: 89 mg/dL (ref 70–99)
Potassium: 3.5 mmol/L (ref 3.5–5.1)
Sodium: 134 mmol/L — ABNORMAL LOW (ref 135–145)
Total Bilirubin: 0.8 mg/dL (ref 0.0–1.2)
Total Protein: 5.7 g/dL — ABNORMAL LOW (ref 6.5–8.1)

## 2024-05-19 LAB — TYPE AND SCREEN

## 2024-05-19 LAB — RPR: RPR Ser Ql: NONREACTIVE

## 2024-05-19 SURGERY — Surgical Case
Anesthesia: Spinal

## 2024-05-19 MED ORDER — ONDANSETRON HCL 4 MG/2ML IJ SOLN
INTRAMUSCULAR | Status: DC | PRN
  Administered 2024-05-19: 4 mg via INTRAVENOUS

## 2024-05-19 MED ORDER — NALOXONE HCL 0.4 MG/ML IJ SOLN: 0.4000 mg | INTRAMUSCULAR | Status: DC | PRN

## 2024-05-19 MED ORDER — ACETAMINOPHEN 500 MG PO TABS: 1000.0000 mg | ORAL_TABLET | Freq: Four times a day (QID) | ORAL | Status: DC

## 2024-05-19 MED ORDER — KETOROLAC TROMETHAMINE 30 MG/ML IJ SOLN
30.0000 mg | Freq: Four times a day (QID) | INTRAMUSCULAR | Status: AC
  Administered 2024-05-19 – 2024-05-20 (×4): 30 mg via INTRAVENOUS
  Filled 2024-05-19 (×4): qty 1

## 2024-05-19 MED ORDER — KETOROLAC TROMETHAMINE 30 MG/ML IJ SOLN
INTRAMUSCULAR | Status: DC | PRN
Start: 2024-05-19 — End: 2024-05-19
  Administered 2024-05-19: 30 mg via INTRAVENOUS

## 2024-05-19 MED ORDER — SIMETHICONE 80 MG PO CHEW: 80.0000 mg | CHEWABLE_TABLET | ORAL | Status: DC | PRN

## 2024-05-19 MED ORDER — BUPIVACAINE IN DEXTROSE 0.75-8.25 % IT SOLN
INTRATHECAL | Status: DC | PRN
  Administered 2024-05-19: 1.6 mL via INTRATHECAL

## 2024-05-19 MED ORDER — SODIUM CHLORIDE 0.9% FLUSH: 3.0000 mL | INTRAVENOUS | Status: DC | PRN

## 2024-05-19 MED ORDER — MORPHINE SULFATE (PF) 0.5 MG/ML IJ SOLN
INTRAMUSCULAR | Status: DC | PRN
  Administered 2024-05-19: .15 mg via INTRATHECAL

## 2024-05-19 MED ORDER — LACTATED RINGERS IV SOLN: INTRAVENOUS | Status: DC

## 2024-05-19 MED ORDER — ORAL CARE MOUTH RINSE: 15.0000 mL | Freq: Once | OROMUCOSAL | Status: DC

## 2024-05-19 MED ORDER — WITCH HAZEL-GLYCERIN EX PADS: 1.0000 | MEDICATED_PAD | CUTANEOUS | Status: DC | PRN

## 2024-05-19 MED ORDER — LIDOCAINE-EPINEPHRINE (PF) 2 %-1:200000 IJ SOLN
INTRAMUSCULAR | Status: AC
  Filled 2024-05-19: qty 20

## 2024-05-19 MED ORDER — OXYTOCIN-SODIUM CHLORIDE 30-0.9 UT/500ML-% IV SOLN
INTRAVENOUS | Status: AC
Start: 2024-05-19 — End: 2024-05-19
  Filled 2024-05-19: qty 500

## 2024-05-19 MED ORDER — TRANEXAMIC ACID-NACL 1000-0.7 MG/100ML-% IV SOLN
INTRAVENOUS | Status: DC | PRN
  Administered 2024-05-19: 1000 mg via INTRAVENOUS

## 2024-05-19 MED ORDER — OXYCODONE HCL 5 MG PO TABS
5.0000 mg | ORAL_TABLET | ORAL | Status: DC | PRN
  Administered 2024-05-21: 5 mg via ORAL
  Filled 2024-05-19: qty 1

## 2024-05-19 MED ORDER — SODIUM CHLORIDE 0.9 % IV SOLN
INTRAVENOUS | Status: DC | PRN
  Administered 2024-05-19: 500 mg via INTRAVENOUS

## 2024-05-19 MED ORDER — OXYTOCIN-SODIUM CHLORIDE 30-0.9 UT/500ML-% IV SOLN: 2.5000 [IU]/h | INTRAVENOUS | Status: AC

## 2024-05-19 MED ORDER — CEFAZOLIN SODIUM-DEXTROSE 2-3 GM-%(50ML) IV SOLR
INTRAVENOUS | Status: DC | PRN
  Administered 2024-05-19: 2 g via INTRAVENOUS

## 2024-05-19 MED ORDER — NALOXONE HCL 4 MG/10ML IJ SOLN: 1.0000 ug/kg/h | INTRAVENOUS | Status: DC | PRN

## 2024-05-19 MED ORDER — SOD CITRATE-CITRIC ACID 500-334 MG/5ML PO SOLN
30.0000 mL | Freq: Once | ORAL | Status: AC
  Administered 2024-05-19: 30 mL via ORAL
  Filled 2024-05-19: qty 30

## 2024-05-19 MED ORDER — METOCLOPRAMIDE HCL 5 MG/ML IJ SOLN
INTRAMUSCULAR | Status: DC | PRN
Start: 2024-05-19 — End: 2024-05-19
  Administered 2024-05-19: 10 mg via INTRAVENOUS

## 2024-05-19 MED ORDER — SODIUM CHLORIDE 0.9 % IV SOLN
INTRAVENOUS | Status: AC
  Filled 2024-05-19: qty 5

## 2024-05-19 MED ORDER — ONDANSETRON HCL 4 MG/2ML IJ SOLN: 4.0000 mg | Freq: Once | INTRAMUSCULAR | Status: DC | PRN

## 2024-05-19 MED ORDER — FERROUS SULFATE 325 (65 FE) MG PO TABS
325.0000 mg | ORAL_TABLET | ORAL | Status: DC
  Administered 2024-05-20: 325 mg via ORAL
  Filled 2024-05-19: qty 1

## 2024-05-19 MED ORDER — TRANEXAMIC ACID-NACL 1000-0.7 MG/100ML-% IV SOLN
INTRAVENOUS | Status: AC
  Filled 2024-05-19: qty 100

## 2024-05-19 MED ORDER — HYDROMORPHONE HCL 2 MG PO TABS: 2.0000 mg | ORAL_TABLET | ORAL | Status: DC | PRN

## 2024-05-19 MED ORDER — FENTANYL CITRATE (PF) 100 MCG/2ML IJ SOLN
INTRAMUSCULAR | Status: AC
  Filled 2024-05-19: qty 2

## 2024-05-19 MED ORDER — ACETAMINOPHEN 10 MG/ML IV SOLN
INTRAVENOUS | Status: DC | PRN
  Administered 2024-05-19: 1000 mg via INTRAVENOUS

## 2024-05-19 MED ORDER — ONDANSETRON HCL 4 MG/2ML IJ SOLN
4.0000 mg | Freq: Three times a day (TID) | INTRAMUSCULAR | Status: DC | PRN
Start: 2024-05-19 — End: 2024-05-21
  Filled 2024-05-19: qty 2

## 2024-05-19 MED ORDER — KETOROLAC TROMETHAMINE 30 MG/ML IJ SOLN
INTRAMUSCULAR | Status: AC
  Filled 2024-05-19: qty 1

## 2024-05-19 MED ORDER — GABAPENTIN 100 MG PO CAPS
300.0000 mg | ORAL_CAPSULE | Freq: Two times a day (BID) | ORAL | Status: DC
  Administered 2024-05-19 – 2024-05-21 (×4): 300 mg via ORAL
  Filled 2024-05-19 (×4): qty 3

## 2024-05-19 MED ORDER — DEXAMETHASONE SOD PHOSPHATE PF 10 MG/ML IJ SOLN
INTRAMUSCULAR | Status: DC | PRN
  Administered 2024-05-19: 10 mg via INTRAVENOUS

## 2024-05-19 MED ORDER — FAMOTIDINE 20 MG PO TABS
20.0000 mg | ORAL_TABLET | Freq: Once | ORAL | Status: AC
  Administered 2024-05-19: 20 mg via ORAL
  Filled 2024-05-19: qty 1

## 2024-05-19 MED ORDER — DEXMEDETOMIDINE HCL IN NACL 80 MCG/20ML IV SOLN
INTRAVENOUS | Status: DC | PRN
  Administered 2024-05-19: 12 ug via INTRAVENOUS
  Administered 2024-05-19: 8 ug via INTRAVENOUS

## 2024-05-19 MED ORDER — FENTANYL CITRATE (PF) 100 MCG/2ML IJ SOLN
INTRAMUSCULAR | Status: DC | PRN
  Administered 2024-05-19: 15 ug via INTRATHECAL

## 2024-05-19 MED ORDER — KETOROLAC TROMETHAMINE 30 MG/ML IJ SOLN: 30.0000 mg | Freq: Four times a day (QID) | INTRAMUSCULAR | Status: AC | PRN

## 2024-05-19 MED ORDER — MEASLES, MUMPS & RUBELLA VAC ~~LOC~~ SUSR: 0.5000 mL | Freq: Once | SUBCUTANEOUS | Status: DC

## 2024-05-19 MED ORDER — DIBUCAINE (PERIANAL) 1 % EX OINT: 1.0000 | TOPICAL_OINTMENT | CUTANEOUS | Status: DC | PRN

## 2024-05-19 MED ORDER — DIPHENHYDRAMINE HCL 25 MG PO CAPS: 25.0000 mg | ORAL_CAPSULE | ORAL | Status: DC | PRN

## 2024-05-19 MED ORDER — MORPHINE SULFATE (PF) 0.5 MG/ML IJ SOLN
INTRAMUSCULAR | Status: AC
  Filled 2024-05-19: qty 10

## 2024-05-19 MED ORDER — MAGNESIUM HYDROXIDE 400 MG/5ML PO SUSP: 30.0000 mL | ORAL | Status: DC | PRN

## 2024-05-19 MED ORDER — PHENYLEPHRINE HCL-NACL 20-0.9 MG/250ML-% IV SOLN
INTRAVENOUS | Status: DC | PRN
  Administered 2024-05-19: 60 ug/min via INTRAVENOUS

## 2024-05-19 MED ORDER — COCONUT OIL OIL: 1.0000 | TOPICAL_OIL | Status: DC | PRN

## 2024-05-19 MED ORDER — DIPHENHYDRAMINE HCL 25 MG PO CAPS: 25.0000 mg | ORAL_CAPSULE | Freq: Four times a day (QID) | ORAL | Status: DC | PRN

## 2024-05-19 MED ORDER — SCOPOLAMINE 1 MG/3DAYS TD PT72
1.0000 | MEDICATED_PATCH | Freq: Once | TRANSDERMAL | Status: DC
Start: 2024-05-19 — End: 2024-05-21

## 2024-05-19 MED ORDER — PHENYLEPHRINE 80 MCG/ML (10ML) SYRINGE FOR IV PUSH (FOR BLOOD PRESSURE SUPPORT)
PREFILLED_SYRINGE | INTRAVENOUS | Status: DC | PRN
  Administered 2024-05-19: 80 ug via INTRAVENOUS

## 2024-05-19 MED ORDER — ONDANSETRON HCL 4 MG/2ML IJ SOLN
4.0000 mg | Freq: Once | INTRAMUSCULAR | Status: AC
  Administered 2024-05-19: 4 mg via INTRAVENOUS

## 2024-05-19 MED ORDER — PRENATAL MULTIVITAMIN CH
1.0000 | ORAL_TABLET | Freq: Every day | ORAL | Status: DC
  Administered 2024-05-20 – 2024-05-21 (×2): 1 via ORAL
  Filled 2024-05-19 (×2): qty 1

## 2024-05-19 MED ORDER — FENTANYL CITRATE (PF) 100 MCG/2ML IJ SOLN: 25.0000 ug | INTRAMUSCULAR | Status: DC | PRN

## 2024-05-19 MED ORDER — SCOPOLAMINE 1 MG/3DAYS TD PT72
1.0000 | MEDICATED_PATCH | Freq: Once | TRANSDERMAL | Status: DC
  Administered 2024-05-19: 1 mg via TRANSDERMAL
  Filled 2024-05-19: qty 1

## 2024-05-19 MED ORDER — SENNOSIDES-DOCUSATE SODIUM 8.6-50 MG PO TABS
2.0000 | ORAL_TABLET | Freq: Every day | ORAL | Status: DC
  Administered 2024-05-20 – 2024-05-21 (×2): 2 via ORAL
  Filled 2024-05-19 (×2): qty 2

## 2024-05-19 MED ORDER — STERILE WATER FOR IRRIGATION IR SOLN: Status: DC | PRN

## 2024-05-19 MED ORDER — CHLORHEXIDINE GLUCONATE 0.12 % MT SOLN: 15.0000 mL | Freq: Once | OROMUCOSAL | Status: DC

## 2024-05-19 MED ORDER — IBUPROFEN 600 MG PO TABS
600.0000 mg | ORAL_TABLET | Freq: Four times a day (QID) | ORAL | Status: DC
  Administered 2024-05-20 – 2024-05-21 (×5): 600 mg via ORAL
  Filled 2024-05-19 (×6): qty 1

## 2024-05-19 MED ORDER — DIPHENHYDRAMINE HCL 50 MG/ML IJ SOLN: 12.5000 mg | INTRAMUSCULAR | Status: DC | PRN

## 2024-05-19 MED ORDER — ZOLPIDEM TARTRATE 5 MG PO TABS
5.0000 mg | ORAL_TABLET | Freq: Every evening | ORAL | Status: DC | PRN
Start: 2024-05-19 — End: 2024-05-21

## 2024-05-19 MED ORDER — ACETAMINOPHEN 10 MG/ML IV SOLN
INTRAVENOUS | Status: AC
  Filled 2024-05-19: qty 100

## 2024-05-19 MED ORDER — AMISULPRIDE (ANTIEMETIC) 5 MG/2ML IV SOLN: 10.0000 mg | Freq: Once | INTRAVENOUS | Status: DC | PRN

## 2024-05-19 MED ORDER — ACETAMINOPHEN 500 MG PO TABS
1000.0000 mg | ORAL_TABLET | Freq: Four times a day (QID) | ORAL | Status: AC
  Administered 2024-05-19 – 2024-05-20 (×4): 1000 mg via ORAL
  Filled 2024-05-19 (×4): qty 2

## 2024-05-19 MED ORDER — OXYTOCIN-SODIUM CHLORIDE 30-0.9 UT/500ML-% IV SOLN
INTRAVENOUS | Status: DC | PRN
  Administered 2024-05-19: 30 [IU] via INTRAVENOUS

## 2024-05-19 MED ORDER — MENTHOL 3 MG MT LOZG: 1.0000 | LOZENGE | OROMUCOSAL | Status: DC | PRN

## 2024-05-19 SURGICAL SUPPLY — 33 items
BENZOIN TINCTURE PRP APPL 2/3 (GAUZE/BANDAGES/DRESSINGS) IMPLANT
CHLORAPREP W/TINT 26 (MISCELLANEOUS) ×2 IMPLANT
CLAMP UMBILICAL CORD (MISCELLANEOUS) ×1 IMPLANT
CLOTH BEACON ORANGE TIMEOUT ST (SAFETY) ×1 IMPLANT
DRSG OPSITE POSTOP 4X10 (GAUZE/BANDAGES/DRESSINGS) ×1 IMPLANT
ELECTRODE REM PT RTRN 9FT ADLT (ELECTROSURGICAL) ×1 IMPLANT
EXTRACTOR VACUUM M CUP 4 TUBE (SUCTIONS) IMPLANT
GAUZE PAD ABD 7.5X8 STRL (GAUZE/BANDAGES/DRESSINGS) IMPLANT
GAUZE SPONGE 4X4 12PLY STRL LF (GAUZE/BANDAGES/DRESSINGS) IMPLANT
GLOVE BIOGEL PI IND STRL 7.0 (GLOVE) ×3 IMPLANT
GLOVE ECLIPSE 7.0 STRL STRAW (GLOVE) ×1 IMPLANT
GOWN STRL REUS W/TWL LRG LVL3 (GOWN DISPOSABLE) ×1 IMPLANT
GOWN STRL REUS W/TWL XL LVL3 (GOWN DISPOSABLE) ×1 IMPLANT
KIT ABG SYR 3ML LUER SLIP (SYRINGE) IMPLANT
NDL HYPO 25X5/8 SAFETYGLIDE (NEEDLE) ×1 IMPLANT
NEEDLE HYPO 22GX1.5 SAFETY (NEEDLE) ×1 IMPLANT
NEEDLE HYPO 25X5/8 SAFETYGLIDE (NEEDLE) ×1 IMPLANT
NS IRRIG 1000ML POUR BTL (IV SOLUTION) ×1 IMPLANT
PACK C SECTION WH (CUSTOM PROCEDURE TRAY) ×1 IMPLANT
PAD ABD 7.5X8 STRL (GAUZE/BANDAGES/DRESSINGS) ×1 IMPLANT
PAD OB MATERNITY 4.3X12.25 (PERSONAL CARE ITEMS) ×1 IMPLANT
RTRCTR C-SECT PINK 25CM LRG (MISCELLANEOUS) IMPLANT
STRIP CLOSURE SKIN 1/2X4 (GAUZE/BANDAGES/DRESSINGS) IMPLANT
SUT PDS AB 0 CTX 36 PDP370T (SUTURE) IMPLANT
SUT VIC AB 0 CTX36XBRD ANBCTRL (SUTURE) ×2 IMPLANT
SUT VIC AB 2-0 CT1 (SUTURE) IMPLANT
SUT VIC AB 4-0 KS 27 (SUTURE) ×1 IMPLANT
SUTURE PLAIN GUT 2.0 ETHICON (SUTURE) IMPLANT
SYR CONTROL 10ML LL (SYRINGE) ×1 IMPLANT
TAPE CLOTH SURG 4X10 WHT LF (GAUZE/BANDAGES/DRESSINGS) IMPLANT
TOWEL OR 17X24 6PK STRL BLUE (TOWEL DISPOSABLE) ×1 IMPLANT
TRAY FOLEY W/BAG SLVR 14FR LF (SET/KITS/TRAYS/PACK) ×1 IMPLANT
WATER STERILE IRR 1000ML POUR (IV SOLUTION) ×1 IMPLANT

## 2024-05-19 NOTE — Op Note (Signed)
 Kelly Ibarra PROCEDURE DATE: 05/19/2024  PREOPERATIVE DIAGNOSES: Intrauterine pregnancy at [redacted]w[redacted]d weeks gestation; premature rupture of membranes; malpresentation: breech;   POSTOPERATIVE DIAGNOSES: The same  PROCEDURE: Low Transverse Cesarean Section  SURGEON:  Dr. Gloris Hugger  ASSISTANT:  Dr. Barkley Angles. An experienced assistant was required given the standard of surgical care given the complexity of the case.  This assistant was needed for exposure, dissection, suctioning, retraction, instrument exchange, assisting with delivery with administration of fundal pressure, and for overall help during the procedure.  ANESTHESIOLOGY TEAM: Anesthesiologist: Corinne Garnette BRAVO, MD CRNA: Edelmiro Elenor NOVAK, CRNA; Gregorio Adrien SAUNDERS, CRNA; Steen Clarita CROME, CRNA  INDICATIONS: Kelly Ibarra is a 18 y.o. G1P0 at [redacted]w[redacted]d here for cesarean section secondary to the indications listed under preoperative diagnoses; please see preoperative note for further details.  The risks of surgery were discussed with the patient including but were not limited to: bleeding which may require transfusion or reoperation; infection which may require antibiotics; injury to bowel, bladder, ureters or other surrounding organs; injury to the fetus; need for additional procedures including hysterectomy in the event of a life-threatening hemorrhage; formation of adhesions; placental abnormalities wth subsequent pregnancies; incisional problems; thromboembolic phenomenon and other postoperative/anesthesia complications.  The patient concurred with the proposed plan, giving informed written consent for the procedure.    FINDINGS:  Viable female infant in frank breech presentation.  Apgars pending at time of note but baby stable in OR. Clear amniotic fluid.  Intact placenta, three vessel cord.  Normal uterus, fallopian tubes and ovaries bilaterally.  ANESTHESIA: Spinal ESTIMATED BLOOD LOSS: 674 ml SPECIMENS: Placenta sent to  L&D COMPLICATIONS: None immediate  PROCEDURE IN DETAIL:  The patient preoperatively received intravenous antibiotics and had sequential compression devices applied to her lower extremities.  She was then taken to the operating room where spinal anesthesia was administered and was found to be adequate. She was then placed in a dorsal supine position with a leftward tilt, and prepped and draped in a sterile manner.  A foley catheter was placed into her bladder and attached to constant gravity.  After an adequate timeout was performed, a Pfannenstiel skin incision was made with scalpel and carried through to the underlying layer of fascia. The fascia was incised in the midline, and this incision was extended bilaterally in a blunt fashion.  The underlying rectus muscles were dissected off the fascia superiorly and inferiorly in a blunt fashion. The rectus muscles were separated in the midline and the peritoneum was entered bluntly. The Alexis self-retaining retractor was introduced into the abdominal cavity.  Attention was turned to the lower uterine segment where a low transverse hysterotomy was made with a scalpel and extended bilaterally bluntly.  The infant was successfully delivered, the cord was clamped and cut after one minute, and the infant was handed over to the awaiting neonatology team. Uterine massage was then administered, and the placenta delivered intact with a three-vessel cord. The uterus was then cleared of clots and debris.  The hysterotomy was closed with 0 Vicryl in a running locked fashion.  Figure-of-eight 0 Vicryl serosal stitches were placed to help with hemostasis.  The pelvis was cleared of all clot and debris. Hemostasis was confirmed on all surfaces.  The retractor was removed.  The peritoneum was closed with a 0 Vicryl running stitch. The fascia was then closed using 0 Vicryl in a running fashion.  The subcutaneous layer was irrigated, reapproximated with 2-0 plain gut interrupted  stitches, and the skin  was closed with a 4-0 Vicryl subcuticular stitch. The patient tolerated the procedure well. Sponge, instrument and needle counts were correct x 3.  She was taken to the recovery room in stable condition.     GLORIS HUGGER, MD, FACOG Obstetrician & Gynecologist, [facility] for [employer], [facility]

## 2024-05-19 NOTE — Anesthesia Postprocedure Evaluation (Signed)
 Anesthesia Post Note  Patient: Kelly Ibarra  Procedure(s) Performed: CESAREAN DELIVERY     Patient location during evaluation: PACU Anesthesia Type: Spinal Level of consciousness: awake and alert Pain management: pain level controlled Vital Signs Assessment: post-procedure vital signs reviewed and stable Respiratory status: spontaneous breathing and respiratory function stable Cardiovascular status: blood pressure returned to baseline and stable Postop Assessment: spinal receding and no apparent nausea or vomiting Anesthetic complications: no   No notable events documented.  Last Vitals:  Vitals:   05/19/24 0800 05/19/24 0812  BP: 127/70 112/75  Pulse: 64 77  Resp: 13   Temp:  36.7 C  SpO2: 98% 100%    Last Pain:  Vitals:   05/19/24 0816  TempSrc:   PainSc: 0-No pain   Pain Goal: Patients Stated Pain Goal: 0 (05/19/24 0136)              Epidural/Spinal Function Cutaneous sensation: Tingles (05/19/24 0816), Patient able to flex knees: Yes (05/19/24 0816), Patient able to lift hips off bed: Yes (05/19/24 0816), Back pain beyond tenderness at insertion site: No (05/19/24 0816), Progressively worsening motor and/or sensory loss: No (05/19/24 0816), Bowel and/or bladder incontinence post epidural: No (05/19/24 0816)  Debby FORBES Like

## 2024-05-19 NOTE — Anesthesia Procedure Notes (Signed)
 Spinal  Patient location during procedure: OR Start time: 05/19/2024 5:26 AM End time: 05/19/2024 5:29 AM Reason for block: surgical anesthesia Staffing Performed: anesthesiologist  Anesthesiologist: Corinne Garnette BRAVO, MD Performed by: Corinne Garnette BRAVO, MD Authorized by: Corinne Garnette BRAVO, MD   Preanesthetic Checklist Completed: patient identified, IV checked, risks and benefits discussed, surgical consent, monitors and equipment checked, pre-op evaluation and timeout performed Spinal Block Patient position: sitting Prep: DuraPrep and site prepped and draped Patient monitoring: continuous pulse ox and blood pressure Approach: midline Location: L3-4 Injection technique: single-shot Needle Needle type: Pencan  Needle gauge: 24 G Assessment Sensory level: T6 Events: CSF return Additional Notes Functioning IV was confirmed and monitors were applied. Sterile prep and drape, including hand hygiene, mask and sterile gloves were used. The patient was positioned and the spine was prepped. The skin was anesthetized with lidocaine.  Free flow of clear CSF was obtained prior to injecting local anesthetic into the CSF.  The spinal needle aspirated freely following injection.  The needle was carefully withdrawn.  The patient tolerated the procedure well. Consent was obtained prior to procedure with all questions answered and concerns addressed. Risks including but not limited to bleeding, infection, nerve damage, paralysis, failed block, inadequate analgesia, allergic reaction, high spinal, itching and headache were discussed and the patient wished to proceed.   Garnette Corinne, MD

## 2024-05-19 NOTE — Anesthesia Preprocedure Evaluation (Addendum)
 Anesthesia Evaluation  Patient identified by MRN, date of birth, ID band Patient awake    Reviewed: Allergy  & Precautions, NPO status , Patient's Chart, lab work & pertinent test results  Airway Mallampati: III  TM Distance: >3 FB Neck ROM: Full    Dental  (+) Teeth Intact, Dental Advisory Given   Pulmonary asthma (PRN albuterol )    Pulmonary exam normal breath sounds clear to auscultation       Cardiovascular negative cardio ROS Normal cardiovascular exam Rhythm:Regular Rate:Normal     Neuro/Psych  PSYCHIATRIC DISORDERS Anxiety Depression    negative neurological ROS     GI/Hepatic negative GI ROS, Neg liver ROS,,,  Endo/Other  Obesity  Renal/GU negative Renal ROS     Musculoskeletal negative musculoskeletal ROS (+)    Abdominal   Peds  Hematology  (+) Blood dyscrasia, anemia Plt 239k   Anesthesia Other Findings Day of surgery medications reviewed with the patient.  Reproductive/Obstetrics (+) Pregnancy (Breech presentation)                              Anesthesia Physical Anesthesia Plan  ASA: 3  Anesthesia Plan: Spinal   Post-op Pain Management: Toradol  IV (intra-op)*   Induction:   PONV Risk Score and Plan: 2 and Scopolamine patch - Pre-op, Dexamethasone and Ondansetron   Airway Management Planned: Natural Airway  Additional Equipment:   Intra-op Plan:   Post-operative Plan:   Informed Consent: I have reviewed the patients History and Physical, chart, labs and discussed the procedure including the risks, benefits and alternatives for the proposed anesthesia with the patient or authorized representative who has indicated his/her understanding and acceptance.     Dental advisory given  Plan Discussed with: CRNA and Anesthesiologist  Anesthesia Plan Comments: (Discussed risks and benefits of and differences between spinal and general. Discussed risks of spinal  including headache, backache, failure, bleeding, infection, and nerve damage. Patient consents to spinal. Questions answered. Coagulation studies and platelet count acceptable.)         Anesthesia Quick Evaluation

## 2024-05-19 NOTE — Discharge Summary (Signed)
 Postpartum Discharge Summary    Patient Name: Kelly Ibarra DOB: Nov 15, 2005 MRN: [id number]  Date of admission: 05/18/2024 Delivery date:05/19/2024 Delivering provider: HERCHEL GRUMET A Date of discharge: 05/21/2024  Admitting diagnosis: Pregnant [Z34.90] Intrauterine pregnancy: [redacted]w[redacted]d     Secondary diagnosis:  Principal Problem:   Status post primary low transverse cesarean section  Additional problems:     Discharge diagnosis: Term Pregnancy Delivered                                              Post partum procedures:NONE Augmentation: N/A Complications: None  Hospital course: Sceduled C/S   18 y.o. yo G1P1001 at [redacted]w[redacted]d was admitted to the hospital 05/18/2024 for cesarean section with the following indication:Malpresentation and PROM. Delivery details are as follows:  Membrane Rupture Time/Date: 6:02 AM,05/19/2024  Delivery Method:C-Section, Low Transverse Operative Delivery:N/A Details of operation can be found in separate operative note.  Patient had a postpartum course complicated by NONE.  She is ambulating, tolerating a regular diet, passing flatus, and urinating well. Patient is discharged home in stable condition on  05/21/24.        Newborn Data: Birth date:05/19/2024 Birth time:6:03 AM Gender:Female Living status:Living Apgars:5 ,6  Weight:3856 g    Magnesium  Sulfate received: No BMZ received: No Rhophylac:N/A MMR:N/A T-DaP:Given prenatally Flu: No RSV Vaccine received: No Transfusion:No  Immunizations received: Immunization History  Administered Date(s) Administered   Tdap 04/22/2024    Physical exam  Vitals:   05/20/24 0510 05/20/24 1346 05/20/24 2350 05/21/24 0615  BP: 105/69 107/78 110/61 114/83  Pulse: 77 80 76 84  Resp: 18 18 18 18   Temp: 98.5 F (36.9 C) 98.4 F (36.9 C) 98.2 F (36.8 C) 98.4 F (36.9 C)  TempSrc: Oral Oral Oral Oral  SpO2: 100% 100% 100% 100%  Weight:      Height:       General: alert, cooperative, and no  distress Lochia: appropriate Uterine Fundus: firm Incision: healing well, no significant drainage, no dehiscence, no significant erythema DVT Evaluation: No evidence of DVT seen on physical exam. Negative Homan's sign. No cords or calf tenderness. No significant calf/ankle edema  Labs: Lab Results  Component Value Date   WBC 18.1 (H) 05/20/2024   HGB 9.2 (L) 05/20/2024   HCT 27.2 (L) 05/20/2024   MCV 77.7 (L) 05/20/2024   PLT 225 05/20/2024      Latest Ref Rng & Units 05/18/2024   11:22 PM  CMP  Glucose 70 - 99 mg/dL 89   BUN 6 - 20 mg/dL 6   Creatinine 9.55 - 8.99 mg/dL 9.19   Sodium 864 - 854 mmol/L 134   Potassium 3.5 - 5.1 mmol/L 3.5   Chloride 98 - 111 mmol/L 106   CO2 22 - 32 mmol/L 19   Calcium 8.9 - 10.3 mg/dL 8.0   Total Protein 6.5 - 8.1 g/dL 5.7   Total Bilirubin 0.0 - 1.2 mg/dL 0.8   Alkaline Phos 38 - 126 U/L 271   AST 15 - 41 U/L 26   ALT 0 - 44 U/L 17    Edinburgh Score:    05/21/2024    6:14 AM  Edinburgh Postnatal Depression Scale Screening Tool  I have been able to laugh and see the funny side of things. 0   I have looked forward with enjoyment to things. 0  I have blamed myself unnecessarily when things went wrong. 1   I have been anxious or worried for no good reason. 2   I have felt scared or panicky for no good reason. 2   Things have been getting on top of me. 1   I have been so unhappy that I have had difficulty sleeping. 0   I have felt sad or miserable. 0   I have been so unhappy that I have been crying. 0   The thought of harming myself has occurred to me. 0   Edinburgh Postnatal Depression Scale Total 6     Data saved with a previous flowsheet row definition   No data recorded  After visit meds:  Allergies as of 05/21/2024       Reactions   Alpha-gal Anaphylaxis        Medication List     STOP taking these medications    aspirin  EC 81 MG tablet   ondansetron  4 MG disintegrating tablet Commonly known as:  ZOFRAN -ODT       TAKE these medications    albuterol  108 (90 Base) MCG/ACT inhaler Commonly known as: VENTOLIN  HFA Inhale 2 puffs into the lungs every 6 (six) hours as needed for wheezing or shortness of breath.   budesonide  32 MCG/ACT nasal spray Commonly known as: RHINOCORT  AQUA Place 2 sprays into both nostrils daily.   CeraVe Moisturizing Crea Apply 1 Application topically daily.   CERAVE SA BODY WASH EX Apply 1 Application topically daily.   cetirizine  10 MG tablet Commonly known as: ZYRTEC  Take 1 tablet (10 mg total) by mouth daily.   EPINEPHrine 0.3 mg/0.3 mL Soaj injection Commonly known as: EPI-PEN Inject 0.3 mg into the muscle as needed for anaphylaxis.   FeroSul 325 (65 Fe) MG tablet Generic drug: ferrous sulfate Take 1 tablet (325 mg total) by mouth every other day.   Ferrous Fumarate  324 (106 Fe) MG Tabs tablet Commonly known as: HEMOCYTE - 106 mg FE Take 1 tablet (106 mg of iron  total) by mouth every other day.   ibuprofen  600 MG tablet Commonly known as: ADVIL  Take 1 tablet (600 mg total) by mouth every 6 (six) hours as needed for moderate pain (pain score 4-6).   oxyCODONE 5 MG immediate release tablet Commonly known as: Oxy IR/ROXICODONE Take 1-2 tablets (5-10 mg total) by mouth every 6 (six) hours as needed for severe pain (pain score 7-10).   Prenatal Complete 14-0.4 MG Tabs Take 1 tablet by mouth daily.   Pulmicort  Flexhaler 90 MCG/ACT inhaler Generic drug: Budesonide  Inhale 2 puffs into the lungs daily.   Spacer/Aero-Holding [employer] Use spacer as directed with inhaler.   Stool Softener/Laxative 50-8.6 MG tablet Generic drug: senna-docusate Take 2 tablets by mouth at bedtime as needed for mild constipation or moderate constipation.         Discharge home in stable condition Infant Feeding: Breast Infant Disposition:home with mother Discharge instruction: per After Visit Summary and Postpartum booklet. Activity: Advance as  tolerated. Pelvic rest for 6 weeks.  Diet: routine diet Future Appointments: Future Appointments  Date Time Provider Department Center  06/25/2024  2:30 PM Cresenzo-Dishmon, Cathlean, CNM [facility]   Follow up Visit:  Follow-up Information     [facility] Follow up.   Contact information: [address] [place]  [address] [phone]                 Please schedule this patient for a In person  postpartum visit in 6 weeks with the following provider: Any provider. Additional Postpartum F/U:Incision check 1 week  Low risk pregnancy complicated by: breech discovered after PROM Delivery mode:  C-Section, Low Transverse Anticipated Birth Control:  Unsure  GLORIS HUGGER, MD, FACOG Obstetrician & Gynecologist, [facility] [employer], [facility]

## 2024-05-19 NOTE — Transfer of Care (Signed)
 Immediate Anesthesia Transfer of Care Note  Patient: Kelly Ibarra  Procedure(s) Performed: CESAREAN DELIVERY  Patient Location: PACU  Anesthesia Type:Spinal  Level of Consciousness: awake  Airway & Oxygen Therapy: Patient Spontanous Breathing  Post-op Assessment: Report given to RN and Post -op Vital signs reviewed and stable  Post vital signs: Reviewed and stable  Last Vitals:  Vitals Value Taken Time  BP 159/134 05/19/24 06:58  Temp 36.7 C 05/19/24 06:57  Pulse 89 05/19/24 07:00  Resp 14 05/19/24 07:00  SpO2 100 % 05/19/24 07:00  Vitals shown include unfiled device data.  Last Pain:  Vitals:   05/19/24 0657  TempSrc: Oral  PainSc:       Patients Stated Pain Goal: 0 (05/19/24 0136)  Complications: No notable events documented.

## 2024-05-19 NOTE — MAU Provider Note (Signed)
 History     [id number]  Arrival date and time: 05/18/24 2206    Chief Complaint  Patient presents with   Rupture of Membranes     HPI Kelly Ibarra is a 18 y.o. at [redacted]w[redacted]d by LMP, who presents for SROM. Started leaking Sunday.     --/--/PENDING (10/13 2307)  Past Medical History:  Diagnosis Date   Anxiety    Asthma    Depression    Vision abnormalities     Past Surgical History:  Procedure Laterality Date   NO PAST SURGERIES      Family History  Problem Relation Age of Onset   Asthma Mother    Allergic rhinitis Mother    Thyroid disease Mother    Diabetes Father     Social History   Socioeconomic History   Marital status: Single    Spouse name: Not on file   Number of children: Not on file   Years of education: Not on file   Highest education level: Not on file  Occupational History   Not on file  Tobacco Use   Smoking status: Never    Passive exposure: Current   Smokeless tobacco: Never  Vaping Use   Vaping status: Former   Substances: Nicotine, Flavoring  Substance and Sexual Activity   Alcohol use: Not Currently   Drug use: Not Currently    Types: Marijuana   Sexual activity: Yes    Birth control/protection: None  Other Topics Concern   Not on file  Social History Narrative   Not on file   Social Drivers of Health   Financial Resource Strain: Medium Risk (10/09/2023)   Overall Financial Resource Strain (CARDIA)    Difficulty of Paying Living Expenses: Somewhat hard  Food Insecurity: No Food Insecurity (10/09/2023)   Hunger Vital Sign    Worried About Running Out of Food in the Last Year: Never true    Ran Out of Food in the Last Year: Never true  Transportation Needs: Unknown (10/09/2023)   PRAPARE - Transportation    Lack of Transportation (Medical): No    Lack of Transportation (Non-Medical): Patient declined  Physical Activity: Sufficiently Active (10/09/2023)   Exercise Vital Sign    Days of Exercise per Week: 7 days    Minutes of  Exercise per Session: 60 min  Stress: Stress Concern Present (10/09/2023)   [employer] of Occupational Health - Occupational Stress Questionnaire    Feeling of Stress : Rather much  Social Connections: Socially Isolated (10/09/2023)   Social Connection and Isolation Panel    Frequency of Communication with Friends and Family: More than three times a week    Frequency of Social Gatherings with Friends and Family: Twice a week    Attends Religious Services: Never    Database administrator or Organizations: No    Attends Banker Meetings: Never    Marital Status: Never married  Intimate Partner Violence: Not At Risk (10/09/2023)   Humiliation, Afraid, Rape, and Kick questionnaire    Fear of Current or Ex-Partner: No    Emotionally Abused: No    Physically Abused: No    Sexually Abused: No    Allergies  Allergen Reactions   Alpha-Gal Anaphylaxis    No current facility-administered medications on file prior to encounter.   Current Outpatient Medications on File Prior to Encounter  Medication Sig Dispense Refill   aspirin  EC 81 MG tablet Take 1 tablet (81 mg total) by mouth daily. Swallow whole. 30  tablet 12   Ferrous Fumarate  (HEMOCYTE - 106 MG FE) 324 (106 Fe) MG TABS tablet Take 1 tablet (106 mg of iron  total) by mouth every other day. 30 tablet 3   ondansetron  (ZOFRAN -ODT) 4 MG disintegrating tablet DISSOLVE 1 TABLET IN MOUTH EVERY 8 HOURS AS NEEDED FOR NAUSEA AND VOMITING 30 tablet 0   Prenatal Vit-Fe Fumarate-FA (PRENATAL COMPLETE) 14-0.4 MG TABS Take 1 tablet by mouth daily. 60 tablet 3   albuterol  (VENTOLIN  HFA) 108 (90 Base) MCG/ACT inhaler Inhale 2 puffs into the lungs every 6 (six) hours as needed for wheezing or shortness of breath. 8 g 2   Budesonide  (PULMICORT  FLEXHALER) 90 MCG/ACT inhaler Inhale 2 puffs into the lungs daily. 1 each 5   budesonide  (RHINOCORT  AQUA) 32 MCG/ACT nasal spray Place 2 sprays into both nostrils daily. 8.43 mL 5   cetirizine  (ZYRTEC )  10 MG tablet Take 1 tablet (10 mg total) by mouth daily. 90 tablet 1   Emollient (CERAVE MOISTURIZING) CREA Apply 1 Application topically daily.     EPINEPHrine 0.3 mg/0.3 mL IJ SOAJ injection Inject 0.3 mg into the muscle as needed for anaphylaxis. (Patient not taking: Reported on 05/06/2024)     Soap & Cleansers (CERAVE SA BODY WASH EX) Apply 1 Application topically daily.     Spacer/Aero-Holding Chambers DEVI Use spacer as directed with inhaler. 1 each 0    Pertinent positives and negative per HPI, all others reviewed and negative  Physical Exam   BP 132/80 (BP Location: Right Arm)   Pulse 93   Temp 98 F (36.7 C) (Oral)   Resp 12   Ht 5' 6 (1.676 m)   Wt 94.4 kg   LMP 08/27/2023   BMI 33.60 kg/m   Patient Vitals for the past 24 hrs:  BP Temp Temp src Pulse Resp Height Weight  05/18/24 2229 132/80 98 F (36.7 C) Oral 93 12 5' 6 (1.676 m) 94.4 kg    Physical Exam Vitals and nursing note reviewed.  Constitutional:      Appearance: She is well-developed.  HENT:     Head: Normocephalic and atraumatic.     Mouth/Throat:     Mouth: Mucous membranes are moist.  Eyes:     Extraocular Movements: Extraocular movements intact.  Cardiovascular:     Rate and Rhythm: Normal rate and regular rhythm.  Pulmonary:     Effort: Pulmonary effort is normal.  Abdominal:     Palpations: Abdomen is soft.     Tenderness: There is no abdominal tenderness.  Skin:    Capillary Refill: Capillary refill takes less than 2 seconds.  Neurological:     General: No focal deficit present.     Mental Status: She is alert.      Cervical Exam Dilation: 1.5 Effacement (%): 60 Station: -3 Exam by:: Zani Kyllonen, MD    Labs Results for orders placed or performed during the hospital encounter of 05/18/24 (from the past 24 hours)  POCT fern test     Status: None   Collection Time: 05/18/24 10:57 PM  Result Value Ref Range   POCT Fern Test Positive = ruptured amniotic membanes   Type and screen  [facility]     Status: None (Preliminary result)   Collection Time: 05/18/24 11:07 PM  Result Value Ref Range   ABO/RH(D) PENDING    Antibody Screen PENDING    Sample Expiration      05/21/2024,2359 Performed at [facility], [address],  [place], [place] [address]   CBC     Status: Abnormal   Collection Time: 05/18/24 11:22 PM  Result Value Ref Range   WBC 6.4 4.0 - 10.5 K/uL   RBC 3.94 3.87 - 5.11 MIL/uL   Hemoglobin 10.1 (L) 12.0 - 15.0 g/dL   HCT 68.8 (L) 63.9 - 53.9 %   MCV 78.9 (L) 80.0 - 100.0 fL   MCH 25.6 (L) 26.0 - 34.0 pg   MCHC 32.5 30.0 - 36.0 g/dL   RDW 79.4 (H) 88.4 - 84.4 %   Platelets 239 150 - 400 K/uL   nRBC 0.0 0.0 - 0.2 %  Comprehensive metabolic panel     Status: Abnormal   Collection Time: 05/18/24 11:22 PM  Result Value Ref Range   Sodium 134 (L) 135 - 145 mmol/L   Potassium 3.5 3.5 - 5.1 mmol/L   Chloride 106 98 - 111 mmol/L   CO2 19 (L) 22 - 32 mmol/L   Glucose, Bld 89 70 - 99 mg/dL   BUN 6 6 - 20 mg/dL   Creatinine, Ser 9.19 0.44 - 1.00 mg/dL   Calcium 8.0 (L) 8.9 - 10.3 mg/dL   Total Protein 5.7 (L) 6.5 - 8.1 g/dL   Albumin 2.3 (L) 3.5 - 5.0 g/dL   AST 26 15 - 41 U/L   ALT 17 0 - 44 U/L   Alkaline Phosphatase 271 (H) 38 - 126 U/L   Total Bilirubin 0.8 0.0 - 1.2 mg/dL   GFR, Estimated >39 >39 mL/min   Anion gap 9 5 - 15    Imaging No results found.  MAU Course  Procedures  Lab Orders         CBC         Comprehensive metabolic panel         RPR         POCT fern test    Meds ordered this encounter  Medications   albuterol  (PROVENTIL ) (2.5 MG/3ML) 0.083% nebulizer solution 3 mL   lactated ringers infusion   FOLLOWED BY Linked Order Group    oxytocin (PITOCIN) IV BOLUS FROM BAG    oxytocin (PITOCIN) IV infusion 30 units in NS 500 mL - Premix   lactated ringers infusion 500-1,000 mL   acetaminophen  (TYLENOL ) tablet 650 mg   oxyCODONE-acetaminophen  (PERCOCET/ROXICET) 5-325 MG per tablet 1 tablet    oxyCODONE-acetaminophen  (PERCOCET/ROXICET) 5-325 MG per tablet 2 tablet   fentaNYL (SUBLIMAZE) injection 50-100 mcg   ondansetron  (ZOFRAN ) injection 4 mg   sodium citrate-citric acid (ORACIT) solution 30 mL   lidocaine (PF) (XYLOCAINE) 1 % injection 30 mL   Imaging Orders  No imaging studies ordered today    MDM Moderate (Level 3-4)  Assessment and Plan  #Breech presentation #[redacted] weeks gestation of pregnancy #SROM  Patient presented due to SROM. Fern positive. Unfortunately on BS US , patient found to be breech. Ate at around 10 PM. Discussed ceasarean section and patient is amenable.   See H&P for further details.    Dispo: To OR when ready. Will admit to PP after .    Jasa Dundon L Shyheim Tanney, MD/MHA 05/19/24 12:13 AM

## 2024-05-19 NOTE — H&P (Signed)
 Obstetric Preoperative History and Physical  Kelly Ibarra is a 18 y.o. G1P0 with IUP at [redacted]w[redacted]d presenting for unscheduled cesarean section.  No acute concerns.   Initially presented to MAU with concern of ROM. Fern positive. Bedside US  indicated breech presentation. Patient amenable to caesarean section.   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   Prenatal labs and studies: ABO, Rh: --/--/PENDING (10/13 2307) Antibody: PENDING (10/13 2307) Rubella: 1.09 (04/16 1409) RPR: Non Reactive (08/06 0914)  HBsAg: Negative (04/16 1409)  HIV: Non Reactive (08/06 0914)  HAD:Wzhjupcz/-- (10/01 1605) 2 hr Glucola  normal Genetic screening LR female Anatomy US  normal  Prenatal Transfer Tool  Maternal Diabetes: No Genetic Screening: Normal Maternal Ultrasounds/Referrals: Normal Fetal Ultrasounds or other Referrals:  None Maternal Substance Abuse:  No Significant Maternal Medications:  None Significant Maternal Lab Results: Group B Strep negative  Past Medical History:  Diagnosis Date   Anxiety    Asthma    Depression    Vision abnormalities     Past Surgical History:  Procedure Laterality Date   NO PAST SURGERIES      OB History  Gravida Para Term Preterm AB Living  1        SAB IAB Ectopic Multiple Live Births          # Outcome Date GA Lbr Len/2nd Weight Sex Type Anes PTL Lv  1 Current             Social History   Socioeconomic History   Marital status: Single    Spouse name: Not on file   Number of children: Not on file   Years of education: Not on file   Highest education level: Not on file  Occupational History   Not on file  Tobacco Use   Smoking status: Never    Passive exposure: Current   Smokeless tobacco: Never  Vaping Use   Vaping status: Former   Substances: Nicotine, Flavoring  Substance and Sexual Activity   Alcohol use: Not Currently   Drug use: Not Currently    Types: Marijuana   Sexual activity: Yes    Birth control/protection: None  Other Topics Concern   Not on file  Social History Narrative   Not on file   Social Drivers of Health   Financial Resource Strain: Medium Risk (10/09/2023)   Overall Financial Resource Strain (CARDIA)    Difficulty of Paying Living Expenses: Somewhat hard  Food Insecurity: No Food Insecurity (10/09/2023)   Hunger Vital Sign    Worried About Running Out of Food in the Last Year: Never true    Ran Out of Food in the Last Year: Never true  Transportation Needs: Unknown (10/09/2023)   PRAPARE - Transportation    Lack of Transportation (Medical): No    Lack of Transportation (Non-Medical): Patient declined  Physical Activity:  Sufficiently Active (10/09/2023)   Exercise Vital Sign    Days of Exercise per Week: 7 days    Minutes of Exercise per Session: 60 min  Stress: Stress Concern Present (10/09/2023)   [employer] of Occupational Health - Occupational Stress Questionnaire    Feeling of Stress : Rather much  Social Connections: Socially Isolated (10/09/2023)   Social Connection and Isolation Panel    Frequency of Communication with Friends and Family: More than three times a week    Frequency of Social Gatherings with Friends and Family: Twice a week    Attends Religious Services: Never     Database administrator or Organizations: No    Attends Engineer, structural: Never    Marital Status: Never married    Family History  Problem Relation Age of Onset   Asthma Mother    Allergic rhinitis Mother    Thyroid disease Mother    Diabetes Father     Medications Prior to Admission  Medication Sig Dispense Refill Last Dose/Taking   aspirin  EC 81 MG tablet Take 1 tablet (81 mg total) by mouth daily. Swallow whole. 30 tablet 12 Past Week   Ferrous Fumarate  (HEMOCYTE - 106 MG FE) 324 (106 Fe) MG TABS tablet Take 1 tablet (106 mg of iron  total) by mouth every other day. 30 tablet 3 Past Week   ondansetron  (ZOFRAN -ODT) 4 MG disintegrating tablet DISSOLVE 1 TABLET IN MOUTH EVERY 8 HOURS AS NEEDED FOR NAUSEA AND VOMITING 30 tablet 0 05/18/2024   Prenatal Vit-Fe Fumarate-FA (PRENATAL COMPLETE) 14-0.4 MG TABS Take 1 tablet by mouth daily. 60 tablet 3 05/18/2024   albuterol  (VENTOLIN  HFA) 108 (90 Base) MCG/ACT inhaler Inhale 2 puffs into the lungs every 6 (six) hours as needed for wheezing or shortness of breath. 8 g 2    Budesonide  (PULMICORT  FLEXHALER) 90 MCG/ACT inhaler Inhale 2 puffs into the lungs daily. 1 each 5    budesonide  (RHINOCORT  AQUA) 32 MCG/ACT nasal spray Place 2 sprays into both nostrils daily. 8.43 mL 5    cetirizine  (ZYRTEC ) 10 MG tablet Take 1 tablet (10 mg total) by mouth daily. 90 tablet 1    Emollient (CERAVE MOISTURIZING) CREA Apply 1 Application topically daily.      EPINEPHrine 0.3 mg/0.3 mL IJ SOAJ injection Inject 0.3 mg into the muscle as needed for anaphylaxis. (Patient not taking: Reported on 05/06/2024)      Soap & Cleansers (CERAVE SA BODY WASH EX) Apply 1 Application topically daily.      Spacer/Aero-Holding Chambers DEVI Use spacer as directed with inhaler. 1 each 0     Allergies  Allergen Reactions   Alpha-Gal Anaphylaxis    Review of Systems: Negative except for what is mentioned in HPI.  Physical Exam: BP 132/80 (BP Location: Right  Arm)   Pulse 93   Temp 98 F (36.7 C) (Oral)   Resp 12   Ht 5' 6 (1.676 m)   Wt 94.4 kg   LMP 08/27/2023   BMI 33.60 kg/m  FHR by Doppler: 145 bpm CONSTITUTIONAL: Well-developed, well-nourished female in no acute distress.  HENT:  Normocephalic, atraumatic. Oropharynx is clear and moist EYES: Conjunctivae and EOM are normal. No scleral icterus.  NECK: Normal range of motion, supple SKIN: Skin is warm and dry. No rash noted. Not diaphoretic. No erythema. NEUROLGIC: Alert and oriented to person, place, and time. Normal reflexes, muscle tone coordination. No cranial nerve deficit noted. PSYCHIATRIC: Normal mood and affect. Normal behavior. CARDIOVASCULAR: Normal heart  rate noted, regular rhythm RESPIRATORY: Effort and breath sounds normal, no problems with respiration noted ABDOMEN: Soft, nontender, nondistended, gravid.  PELVIC: Deferred MUSCULOSKELETAL: Normal range of motion. No edema and no tenderness. 2+ distal pulses.   Pertinent Labs/Studies:   Results for orders placed or performed during the hospital encounter of 05/18/24 (from the past 72 hours)  POCT fern test     Status: None   Collection Time: 05/18/24 10:57 PM  Result Value Ref Range   POCT Fern Test Positive = ruptured amniotic membanes   Type and screen [facility]     Status: None (Preliminary result)   Collection Time: 05/18/24 11:07 PM  Result Value Ref Range   ABO/RH(D) PENDING    Antibody Screen PENDING    Sample Expiration      05/21/2024,2359 Performed at [facility], [address], [place], [place] [address]   CBC     Status: Abnormal   Collection Time: 05/18/24 11:22 PM  Result Value Ref Range   WBC 6.4 4.0 - 10.5 K/uL   RBC 3.94 3.87 - 5.11 MIL/uL   Hemoglobin 10.1 (L) 12.0 - 15.0 g/dL   HCT 68.8 (L) 63.9 - 53.9 %   MCV 78.9 (L) 80.0 - 100.0 fL   MCH 25.6 (L) 26.0 - 34.0 pg   MCHC 32.5 30.0 - 36.0 g/dL   RDW 79.4 (H) 88.4 - 84.4 %   Platelets 239 150 - 400 K/uL    nRBC 0.0 0.0 - 0.2 %    Comment: Performed at [facility], [address], [place], [place] [address]  Comprehensive metabolic panel     Status: Abnormal   Collection Time: 05/18/24 11:22 PM  Result Value Ref Range   Sodium 134 (L) 135 - 145 mmol/L   Potassium 3.5 3.5 - 5.1 mmol/L   Chloride 106 98 - 111 mmol/L   CO2 19 (L) 22 - 32 mmol/L   Glucose, Bld 89 70 - 99 mg/dL    Comment: Glucose reference range applies only to samples taken after fasting for at least 8 hours.   BUN 6 6 - 20 mg/dL   Creatinine, Ser 9.19 0.44 - 1.00 mg/dL   Calcium 8.0 (L) 8.9 - 10.3 mg/dL   Total Protein 5.7 (L) 6.5 - 8.1 g/dL   Albumin 2.3 (L) 3.5 - 5.0 g/dL   AST 26 15 - 41 U/L   ALT 17 0 - 44 U/L   Alkaline Phosphatase 271 (H) 38 - 126 U/L   Total Bilirubin 0.8 0.0 - 1.2 mg/dL   GFR, Estimated >39 >39 mL/min    Comment: (NOTE) Calculated using the CKD-EPI Creatinine Equation (2021)    Anion gap 9 5 - 15    Comment: Performed at [facility], [address], [place], [place] [address]    Assessment and Plan :TELESHIA LEMERE is a 18 y.o. G1P0 at [redacted]w[redacted]d being admitted for unscheduled cesarean section due to PROM and breech presentation. The risks of cesarean section discussed with the patient included but were not limited to: bleeding which may require transfusion or reoperation; infection which may require antibiotics; injury to bowel, bladder, ureters or other surrounding organs; injury to the fetus; need for additional procedures including hysterectomy in the event of a life-threatening hemorrhage; placental abnormalities wth subsequent pregnancies, incisional problems, thromboembolic phenomenon and other postoperative/anesthesia complications. The patient concurred with the proposed plan, giving informed written consent for the procedure. Patient has been NPO since  last night she will remain NPO for procedure. Anesthesia and OR aware. Preoperative prophylactic antibiotics and SCDs ordered on call to  the OR. To OR when ready.    Barkley Angles, MD OB Fellow, Faculty Practice [facility], Center for [employer]

## 2024-05-20 ENCOUNTER — Encounter: Admitting: Advanced Practice Midwife

## 2024-05-20 LAB — CBC
HCT: 27.2 % — ABNORMAL LOW (ref 36.0–46.0)
Hemoglobin: 9.2 g/dL — ABNORMAL LOW (ref 12.0–15.0)
MCH: 26.3 pg (ref 26.0–34.0)
MCHC: 33.8 g/dL (ref 30.0–36.0)
MCV: 77.7 fL — ABNORMAL LOW (ref 80.0–100.0)
Platelets: 225 K/uL (ref 150–400)
RBC: 3.5 MIL/uL — ABNORMAL LOW (ref 3.87–5.11)
RDW: 20.5 % — ABNORMAL HIGH (ref 11.5–15.5)
WBC: 18.1 K/uL — ABNORMAL HIGH (ref 4.0–10.5)
nRBC: 0 % (ref 0.0–0.2)

## 2024-05-20 NOTE — Lactation Note (Signed)
 This note was copied from a baby's chart. Lactation Consultation Note  Patient Name: Kelly Ibarra Unijb'd Date: 05/20/2024 Age:20 hours Reason for consult: Initial assessment;1st time breastfeeding;Early term 37-38.6wks.  P1, ETI female infant, MOB latched infant on her left breast with pillow support using the cross cradle hold, infant latched with depth, sustaining her latch and still breastfeeding after 10 minutes when LC left the room. MOB will continue to breastfeed infant by cues, on demand, 8-12 times within 24 hours, skin to skin. Infant had 2 stools and 3 voids since birth. LC discussed the importance of maternal rest, meals and hydration. MOB was made aware of O/P services, breastfeeding support groups, community resources, and our phone # for post-discharge questions.    Maternal Data Has patient been taught Hand Expression?: Yes Does the patient have breastfeeding experience prior to this delivery?: No  Feeding Mother's Current Feeding Choice: Breast Milk  LATCH Score Latch: Grasps breast easily, tongue down, lips flanged, rhythmical sucking.  Audible Swallowing: A few with stimulation  Type of Nipple: Everted at rest and after stimulation  Comfort (Breast/Nipple): Soft / non-tender  Hold (Positioning): Assistance needed to correctly position infant at breast and maintain latch.  LATCH Score: 8   Lactation Tools Discussed/Used    Interventions Interventions: Breast feeding basics reviewed;Assisted with latch;Skin to skin;Adjust position;Support pillows;Position options;Expressed milk;Breast massage;Breast compression;Education;CDC milk storage guidelines;CDC Guidelines for Breast Pump Cleaning;LC Services brochure;Guidelines for Milk Supply and Pumping Schedule Handout  Discharge Pump: DEBP;Personal  Consult Status Consult Status: Follow-up Date: 05/20/24 Follow-up type: In-patient    Kelly Ibarra 05/20/2024, 1:02 AM

## 2024-05-20 NOTE — Lactation Note (Signed)
 This note was copied from a baby's chart. Lactation Consultation Note  Patient Name: Kelly Ibarra Date: 05/20/2024 Age:20 hours Reason for consult: Follow-up assessment;Primapara;Early term 37-38.6wks  P1. Mom stated BF going well at this time. Praised mom for good feedings and I&O. Asked mom to call for [facility] tonight to see latch and help if needed. Baby sleeping at this time. Maternal Data    Feeding    LATCH Score                    Lactation Tools Discussed/Used    Interventions    Discharge    Consult Status Consult Status: Follow-up Date: 05/21/24 Follow-up type: In-patient    Kelly Ibarra 05/20/2024, 11:58 PM

## 2024-05-20 NOTE — Progress Notes (Signed)
 POSTPARTUM PROGRESS NOTE  POD #1  Subjective:  Kelly Ibarra is a 18 y.o. G1P1001 s/p pLTCS at [redacted]w[redacted]d for breech with PPROM.  No acute events overnight. She reports she is doing well. She denies any problems with ambulating, voiding or po intake. Denies nausea or vomiting. She has not yet passed flatus. Pain is well controlled.  Lochia is slightly ehavy.  Objective: BP 107/78 (BP Location: Right Arm)   Pulse 80   Temp 98.4 F (36.9 C) (Oral)   Resp 18   Ht 5' 6 (1.676 m)   Wt 94.4 kg   LMP 08/27/2023   SpO2 100%   Breastfeeding Unknown   BMI 33.60 kg/m   Physical Exam:  General: alert, cooperative and no distress Chest: no respiratory distress Heart:regular rate, distal pulses intact Abdomen: soft, nontender,  Uterine Fundus: firm, appropriately tender DVT Evaluation: No calf swelling or tenderness Extremities: no LE edema Skin: warm, dry; incision clean/dry/intact w/ pressure dressing in place  Recent Labs    05/18/24 2322 05/20/24 0440  HGB 10.1* 9.2*  HCT 31.1* 27.2*    Assessment/Plan: Kelly Ibarra is a 18 y.o. G1P1001 s/p pLTCS at [redacted]w[redacted]d for breech with PROM.  POD#1 - Doing well; pain well controlled.  Routine postpartum care  OOB, ambulated  Lovenox for VTE prophylaxis Anemia: asymptomatic  Start po ferrous sulfate every other day Contraception: leaning towards outpatient IUD Feeding: breast  Dispo: Plan for discharge POD#2.   LOS: 2 days   Donnice CHRISTELLA Carolus, MD/MPH Attending Family Medicine Physician, [facility] for [facility], [facility]  05/20/2024, 3:16 PM

## 2024-05-21 ENCOUNTER — Other Ambulatory Visit (HOSPITAL_COMMUNITY): Payer: Self-pay

## 2024-05-21 MED ORDER — SENNOSIDES-DOCUSATE SODIUM 8.6-50 MG PO TABS
2.0000 | ORAL_TABLET | Freq: Every evening | ORAL | 0 refills | Status: AC | PRN
  Filled 2024-05-21: qty 30, 15d supply, fill #0

## 2024-05-21 MED ORDER — OXYCODONE HCL 5 MG PO TABS
5.0000 mg | ORAL_TABLET | Freq: Four times a day (QID) | ORAL | 0 refills | Status: AC | PRN
  Filled 2024-05-21: qty 20, 4d supply, fill #0

## 2024-05-21 MED ORDER — FERROUS SULFATE 325 (65 FE) MG PO TABS
325.0000 mg | ORAL_TABLET | ORAL | 0 refills | Status: AC
  Filled 2024-05-21: qty 30, 60d supply, fill #0

## 2024-05-21 MED ORDER — IBUPROFEN 600 MG PO TABS
600.0000 mg | ORAL_TABLET | Freq: Four times a day (QID) | ORAL | 0 refills | Status: AC | PRN
  Filled 2024-05-21: qty 30, 8d supply, fill #0

## 2024-05-21 NOTE — Discharge Instructions (Signed)

## 2024-05-21 NOTE — Patient Instructions (Signed)
 If interested in an outpatient lactation consult in office or virtually please reach out to us  at [facility] (First Floor) [address] [place]  Please feel free to out with any lactation related questions or concerns [phone]  to leave a message for our lactation voicemail box.  Lactation support groups:  [facility], Tuesdays 10:00 am -12:00 pm at [address] on the second floor in the conference room, lactating parents and lap babies welcome.  [email or web address]  [email or web address]      Kelly Ibarra, [facility] for [facility]

## 2024-05-21 NOTE — Progress Notes (Signed)
 Post Partum Day 2 Subjective: Kelly Ibarra is a 18 y.o. G1P1001 s/p pLTCS at [redacted]w[redacted]d for breech with PPROM.  Tess reports no complaints, up ad lib, voiding, tolerating PO, and + flatus. She reports that although she's passing flatus, she has not had a BM yet. Was reassured this can be normal after both pregnancy and abdominal surgery and has laxatives if she wants them. No other complaints at this time.   Objective: Blood pressure 114/83, pulse 84, temperature 98.4 F (36.9 C), temperature source Oral, resp. rate 18, height 5' 6 (1.676 m), weight 94.4 kg, last menstrual period 08/27/2023, SpO2 100%, unknown if currently breastfeeding.  Physical Exam:  General: alert, cooperative, and no distress Lochia: appropriate Uterine Fundus: firm Incision: healing well, no significant drainage, no dehiscence, no significant erythema DVT Evaluation: No evidence of DVT seen on physical exam. Negative Homan's sign. No cords or calf tenderness. No significant calf/ankle edema.  Recent Labs    05/18/24 2322 05/20/24 0440  HGB 10.1* 9.2*  HCT 31.1* 27.2*    Assessment/Plan: Kelly Ibarra is a 18 y.o. G1P1001 s/p pLTCS at [redacted]w[redacted]d for breech with PPROM.   #POD 1 - Doing well; pain well controlled.  - Routine postpartum care - Lovenox for VTE prophylaxis - po iron  q2d   Feeding: breast Contraception: undecided  Plan for discharge tomorrow  LOS: 3 days   Terrace Compton, MD 05/21/2024, 9:03 AM    Attestation of Supervision of Resident: Evaluation and management procedures were performed by the learners: Family Medicine Resident under my supervision. I was immediately available for direct supervision, assistance and direction throughout this encounter.  I also confirm that I have verified the information documented in the resident's note, and that I have also personally reperformed the pertinent components of the physical exam and all of the medical decision making activities.  I have also made  any necessary editorial changes.  Barabara Maier, DO FMOB Fellow, Faculty Practice [facility], Center for [employer]

## 2024-05-21 NOTE — Progress Notes (Signed)
 CSW received a consult due to hx of anxiety/depression; and met MOB at bedside to complete a mental health assessment. CSW entered the room, introduced herself and acknowledged she had family present. CSW asked MOB for privacy reasons could her family stepout for the assessment; MOB was agreeable and her family stepped out. CSW explained her role and the reason for the visit. MOB was polite, easy to engage, receptive to meeting with CSW, and appeared forthcoming.  CSW congratulated MOB on the arrival of the infant and how was the delivery process. MOB reported the delivery was unexpected over the span of 3 days her water broke and the infant was breeched. MOB reported she was notified that since the infant was breeched she had to get a C-section. MOB reported currently this doesn't feel like reality however she is excited for the infant. CSW inquired about MOB's mental health history. MOB reported being diagnosed with anxiety and depression in the 8th-9th grade. MOB reported being hospitalized in 2019 and 2023 due self harm thoughts and thoughts of suicide. MOB reported each admission lasted for 1 week and found the support helpful. MOB reported participating in therapy and being prescribed medication in the past; however she denied current supportive regiment. CSW asked MOB how has she coped overtime with her mental health. MOB reported she likes coloring and talking with her mom/FOB. CSW asked MOB would therapy resources be supportive during this PP period; MOB declined. MOB reported her supports as FOB, and her mom. CSW provided education regarding the baby blues period vs. perinatal mood disorders, discussed treatment and gave resources for mental health follow up if concerns arise.  CSW recommends self-evaluation during the postpartum time period using the New Mom Checklist from Postpartum Progress and encouraged MOB to contact a medical professional if symptoms are noted at any time.    CSW asked MOB has  she selected a pediatrician for the infant's follow up visits; MOB said [facility].  MOB reported having all essential items for the infant including a carseat, bassinet and crib for safe sleeping. CSW provided review of Sudden Infant Death Syndrome (SIDS) precautions.  CSW identifies no further need for intervention and no barriers to discharge at this time.  Rosina Molt, [place] Clinical Social Worker [phone]

## 2024-05-26 ENCOUNTER — Ambulatory Visit: Admitting: Obstetrics and Gynecology

## 2024-05-26 VITALS — BP 118/74 | HR 90 | Ht 64.0 in | Wt 178.2 lb

## 2024-05-26 DIAGNOSIS — Z98891 History of uterine scar from previous surgery: Secondary | ICD-10-CM | POA: Diagnosis not present

## 2024-05-26 NOTE — Progress Notes (Signed)
    PostOp Visit Note  Kelly Ibarra is a 18 y.o. G39P1001 female who presents for a postoperative visit. She is 1 week postop following a cesarean section completed on 10/14   Today she notes occasional sharp pain , occasional dizziness and shakiness Denies fever or chills.  Tolerating gen diet.  +Flatus, Regular BMs.  Pain is getting better has ibuprofen  and oxy as needed Overall doing well and reports no acute complaints   Review of Systems Pertinent items are noted in HPI.    Objective:  BP 118/74 (BP Location: Right Arm, Patient Position: Sitting, Cuff Size: Normal)   Pulse 90   Ht 5' 4 (1.626 m)   Wt 178 lb 3.2 oz (80.8 kg)   LMP 08/27/2023   Breastfeeding Yes   BMI 30.59 kg/m    Physical Examination:  GENERAL ASSESSMENT: well developed and well nourished SKIN: normal color, no lesions CHEST: normal air exchange, respiratory effort normal with no retractions ABDOMEN: soft, non-distended INCISION: well approximated, no drainage or erythema  EXTREMITY: normal and symmetric movement, normal range of motion PSYCH: mood appropriate, normal affect       Assessment and Plan:   1. S/P cesarean section (Primary) Return for pp visit  Discussed incision care Encouraged eating every 2-3 hours, adequate hydration, continue oral iron  supplement, will recheck at pp visit    Future Appointments  Date Time Provider Department Center  06/25/2024  2:30 PM Cresenzo-Dishmon, Cathlean, CNM [facility]    Nidia Daring, FNP [facility] for [employer], [facility]

## 2024-05-27 ENCOUNTER — Encounter: Admitting: Women's Health

## 2024-05-31 ENCOUNTER — Inpatient Hospital Stay (HOSPITAL_COMMUNITY)
Admission: AD | Admit: 2024-05-31 | Discharge: 2024-05-31 | Disposition: A | Discharge status: Home / Self Care | Attending: Family Medicine | Admitting: Family Medicine

## 2024-05-31 ENCOUNTER — Encounter (HOSPITAL_COMMUNITY): Payer: Self-pay

## 2024-05-31 ENCOUNTER — Other Ambulatory Visit: Payer: Self-pay

## 2024-05-31 DIAGNOSIS — G8918 Other acute postprocedural pain: Secondary | ICD-10-CM | POA: Insufficient documentation

## 2024-05-31 DIAGNOSIS — O900 Disruption of cesarean delivery wound: Secondary | ICD-10-CM | POA: Diagnosis present

## 2024-05-31 DIAGNOSIS — L7682 Other postprocedural complications of skin and subcutaneous tissue: Secondary | ICD-10-CM

## 2024-05-31 NOTE — MAU Provider Note (Signed)
 History     CSN: [id number]  Arrival date and time: 05/31/24 0040 First Provider Initiated Contact with Patient 05/31/2024 12:54 AM  Chief Complaint  Patient presents with   Post-op Problem    HPI Kelly Ibarra is a 18 y.o. G1P1001 at Unknown, Not found., who presents to the Maternity Assessment Unit for incision check. Patient states she called the OB nurse line and was advised to present. She has concerns about the R end of her incision being more separated than the other parts. Has intermittent pain there. Some tenderness, denies bleeding/exudate, erythema. She reports that at her 1 week incision check a swab was applied to the wound, possibly silver nitrate but she is unsure. Also unsure how to wash over/around the wound. As for pain, it is intermittent and mild. She feels comfortable using the pain meds she was prescribed at discharge.   ROS (+) pain, would separation (-) f/c, erythema, exudate, bleeding  Medications Prior to Admission  Medication Sig Dispense Refill Last Dose/Taking   albuterol  (VENTOLIN  HFA) 108 (90 Base) MCG/ACT inhaler Inhale 2 puffs into the lungs every 6 (six) hours as needed for wheezing or shortness of breath. 8 g 2    Budesonide  (PULMICORT  FLEXHALER) 90 MCG/ACT inhaler Inhale 2 puffs into the lungs daily. 1 each 5    budesonide  (RHINOCORT  AQUA) 32 MCG/ACT nasal spray Place 2 sprays into both nostrils daily. 8.43 mL 5    cetirizine  (ZYRTEC ) 10 MG tablet Take 1 tablet (10 mg total) by mouth daily. 90 tablet 1    Emollient (CERAVE MOISTURIZING) CREA Apply 1 Application topically daily. (Patient not taking: Reported on 05/26/2024)      EPINEPHrine 0.3 mg/0.3 mL IJ SOAJ injection Inject 0.3 mg into the muscle as needed for anaphylaxis.      Ferrous Fumarate  (HEMOCYTE - 106 MG FE) 324 (106 Fe) MG TABS tablet Take 1 tablet (106 mg of iron  total) by mouth every other day. (Patient not taking: Reported on 05/26/2024) 30 tablet 3    ferrous sulfate 325 (65 FE) MG  tablet Take 1 tablet (325 mg total) by mouth every other day. 30 tablet 0    ibuprofen  (ADVIL ) 600 MG tablet Take 1 tablet (600 mg total) by mouth every 6 (six) hours as needed for moderate pain (pain score 4-6). 30 tablet 0    oxyCODONE (OXY IR/ROXICODONE) 5 MG immediate release tablet Take 1-2 tablets (5-10 mg total) by mouth every 6 (six) hours as needed for severe pain (pain score 7-10). 20 tablet 0    Prenatal Vit-Fe Fumarate-FA (PRENATAL COMPLETE) 14-0.4 MG TABS Take 1 tablet by mouth daily. 60 tablet 3    senna-docusate (SENOKOT-S) 8.6-50 MG tablet Take 2 tablets by mouth at bedtime as needed for mild constipation or moderate constipation. 30 tablet 0    Soap & Cleansers (CERAVE SA BODY WASH EX) Apply 1 Application topically daily. (Patient not taking: Reported on 05/26/2024)      Spacer/Aero-Holding Chambers DEVI Use spacer as directed with inhaler. 1 each 0     Past Medical History:  Diagnosis Date   Anxiety    Asthma    Depression    Vision abnormalities     Past Surgical History:  Procedure Laterality Date   CESAREAN SECTION N/A 05/19/2024   Procedure: CESAREAN DELIVERY;  Surgeon: Herchel Gloris DELENA, MD;  Location: [facility] LD ORS;  Service: Obstetrics;  Laterality: N/A;   NO PAST SURGERIES       Allergies:  Allergies  Allergen  Reactions   Alpha-Gal Anaphylaxis    ROS reviewed and pertinent positives and negatives as documented in HPI.    Physical Exam  BP 95/78 (BP Location: Right Arm)   Pulse 94   Temp 98 F (36.7 C) (Oral)   Resp 16   Ht 5' 5 (1.651 m)   Wt 80.8 kg   SpO2 100%   BMI 29.64 kg/m   Gen: alert, no acute distress CV: regular rate Resp: nonlabored Abd: well healing pfannenstiel incision, the R lateral 1cm is not as well approximated as the rest of the incision. Nontender, no erythema, or exudate. Steri strips applied   Assessment and Plan  MDM AJEENAH HEINY is a 18 y.o. G1P1001 at Unknown, Not found., who presents to the MAU for incision check.  Ddx: incision healing, incision dehiscence, nerve trauma, infection. Pt feeling reassured after examination, discussion of washing, and steri strip application.      ICD-10-CM   1. Pain at surgical incision  L76.82 Discharge patient       Results pending at the time of DC: none Dispo: DC home in stable condition with return precautions discussed and included in AVS.    Barabara Maier, DO FMOB Fellow, Faculty Practice [facility], Center for [facility]

## 2024-05-31 NOTE — MAU Note (Signed)
 Kelly Ibarra is a 18 y.o. at Unknown here in MAU reporting: post op incision pain on the right side that started about 2 days ago. Patient states that pain is intermittent. She was seen for a post op incision check on 10/21 and silver nitrate was applied to the area. States that there is some drainage, but denies warmth or redness to the area. Denies fever and chills   Onset of complaint: 2 days Pain score: 5/10 Vitals:   05/31/24 0055 05/31/24 0113  BP: 120/77 95/78  Pulse: 77 94  Resp: 16 16  Temp: 98.5 F (36.9 C) 98 F (36.7 C)  SpO2: 99% 100%     Lab orders placed from triage: n/a

## 2024-05-31 NOTE — ED Notes (Signed)
 Pt seen walking to peds side of ED with child and partner.

## 2024-05-31 NOTE — ED Provider Triage Note (Signed)
 Emergency Medicine Provider Triage Evaluation Note  Kelly Ibarra , a 18 y.o. female  was evaluated in triage.  Pt is 12 days postop from cesarean section G1, P1 who presents with concern for the right side of her wound popping open.  No worsening pain, no fevers, no drainage.  Review of Systems  Positive: As above Negative: Fevers, chills  Physical Exam  BP 120/77   Pulse 77   Temp 98.5 F (36.9 C) (Oral)   Resp 16   Ht 5' 4 (1.626 m)   Wt 80.8 kg   SpO2 99%   BMI 30.58 kg/m  Gen:   Awake, no distress   Resp:  Normal effort  MSK:   Moves extremities without difficulty  Other:  Approximate 1.5 cm of very superficial dehiscence of the cesarean wound on the right most aspect without discharge, erythema, or induration.  Medical Decision Making  Medically screening exam initiated at 12:57 AM.  Appropriate orders placed.  Kelly Ibarra was informed that the remainder of the evaluation will be completed by another provider, this initial triage assessment does not replace that evaluation, and the importance of remaining in the ED until their evaluation is complete.  I spoke with Dr. Faythe, OB/GYN in the MAU who is agreeable to accepting the patient to her service.  I appreciate her collaboration of care this patient.  ED RN Heather to call RN report and transport.  This chart was dictated using voice recognition software, Dragon. Despite the best efforts of this provider to proofread and correct errors, errors may still occur which can change documentation meaning.    Bobette Pleasant SAUNDERS, PA-C 05/31/24 [phone]

## 2024-05-31 NOTE — ED Triage Notes (Signed)
 Pt had C section 12 days ago and has a small area of her incision opening. No signs of infection noted.

## 2024-06-17 ENCOUNTER — Encounter: Payer: Self-pay | Admitting: *Deleted

## 2024-06-18 ENCOUNTER — Encounter: Payer: Self-pay | Admitting: Advanced Practice Midwife

## 2024-06-25 ENCOUNTER — Encounter: Payer: Self-pay | Admitting: Advanced Practice Midwife

## 2024-06-25 ENCOUNTER — Ambulatory Visit (INDEPENDENT_AMBULATORY_CARE_PROVIDER_SITE_OTHER): Admitting: Advanced Practice Midwife

## 2024-06-25 DIAGNOSIS — Z4889 Encounter for other specified surgical aftercare: Secondary | ICD-10-CM

## 2024-06-25 DIAGNOSIS — Z1332 Encounter for screening for maternal depression: Secondary | ICD-10-CM | POA: Diagnosis not present

## 2024-06-25 DIAGNOSIS — L929 Granulomatous disorder of the skin and subcutaneous tissue, unspecified: Secondary | ICD-10-CM | POA: Diagnosis not present

## 2024-06-25 NOTE — Patient Instructions (Addendum)
 [facility] offers online women's holistic mental health counseling and therapy provided by licensed mental health counselors and therapists.   You can refer yourself using the link below: (if it isn't clickable from your mychart account, copy and paste it in a new browser).  If you have ANY problems, please let me know and I will help troubleshoot.   [email or web address]  OR  [email or web address]

## 2024-06-25 NOTE — Progress Notes (Signed)
 Post Partum Visit Note   Chief Complaint:   Postpartum Care (C-section)  History of Present Illness:   Kelly Ibarra is a 18 y.o. G34P1001 African American female being seen today for a postpartum visit. She is 5 weeks postpartum following a primary cesarean section, low transverse incision at 38 gestational weeks. She had PROM and was found to be breech.IOL: No, . Anesthesia: spinal.  Laceration: n/a.  Complications: none. Inpatient contraception: no.   Pregnancy uncomplicated. Tobacco use: no. Substance use disorder: no. Patient's last menstrual period was 08/27/2023.  Postpartum course has been uncomplicated. Bleeding no bleeding. Bowel function is normal. Bladder function is normal. Urinary incontinence? No, fecal incontinence? No Patient is not sexually active. Last sexual activity: prior to birth.    Upstream - 06/25/24 1444       Pregnancy Intention Screening   Does the patient want to become pregnant in the next year? No    Does the patient's partner want to become pregnant in the next year? No    Would the patient like to discuss contraceptive options today? No      Contraception Wrap Up   Current Method Female Condom    End Method Female Condom    Contraception Counseling Provided No         The pregnancy intention screening data noted above was reviewed. Potential methods of contraception were discussed. The patient elected to proceed with Female Condom.  Edinburgh Postpartum Depression Screening: Negative  Doesn't feel depressed/anxious; has a hx of both, not being treated now.  [facility] information given prn   Edinburgh Postnatal Depression Scale - 06/25/24 1438       Edinburgh Postnatal Depression Scale:  In the Past 7 Days   I have been able to laugh and see the funny side of things. 0    I have looked forward with enjoyment to things. 1    I have blamed myself unnecessarily when things went wrong. 2    I have been anxious or worried for no good reason. 2    I have  felt scared or panicky for no good reason. 1    Things have been getting on top of me. 1    I have been so unhappy that I have had difficulty sleeping. 1    I have felt sad or miserable. 1    I have been so unhappy that I have been crying. 1    The thought of harming myself has occurred to me. 0    Edinburgh Postnatal Depression Scale Total 10         Baby's course has been uncomplicated. Baby is feeding by breast: milk supply adequate. Infant has a pediatrician/family doctor? Yes.   Review of Systems:   Pertinent items are noted in HPI Denies Abnormal vaginal discharge w/ itching/odor/irritation, headaches, visual changes, shortness of breath, chest pain, abdominal pain, severe nausea/vomiting, or problems with urination or bowel movements. Pertinent History Reviewed:  Reviewed past medical,surgical, obstetrical and family history.  Reviewed problem list, medications and allergies. OB History  Gravida Para Term Preterm AB Living  1 1 1   1   SAB IAB Ectopic Multiple Live Births     0 1    # Outcome Date GA Lbr Len/2nd Weight Sex Type Anes PTL Lv  1 Term 05/19/24 [redacted]w[redacted]d  8 lb 8 oz (3.856 kg) F CS-LTranv Spinal  LIV   Physical Assessment:   Vitals:   06/25/24 1436  BP: 100/68  Pulse: 80  Weight: 172 lb (78 kg)  Height: 5' 6 (1.676 m)  Body mass index is 27.76 kg/m.  Objective:  Blood pressure 100/68, pulse 80, height 5' 6 (1.676 m), weight 172 lb (78 kg), last menstrual period 08/27/2023, currently breastfeeding.  General:  alert, cooperative, and no distress   Breasts:  negative  Lungs: Normal respiratory effort  Heart:  regular rate and rhythm  Abdomen: soft, non-tender, incision well healed; 2-77mm area on left side weepy/granulation tissue.  Painted w/AgNO3 and gentian violet   Vulva:  not evaluated  Vagina: not evaluated  Cervix:  normal  Corpus: Well involuted  Adnexa:  not evaluated  Rectal Exam: no hemorrhoids   2       No results found for this or any  previous visit (from the past 24 hours).  Assessment & Plan:  1) Postpartum exam 2) 5 wks s/p primary cesarean section, low transverse incision 3) breast feeding 4) Depression screening 5) Contraception Plans Condoms,  Aware of Plan B  Essential components of care per ACOG recommendations:  1.  Mood and well being:  If positive depression screen, discussed and plan developed.  If using tobacco we discussed reduction/cessation and risk of relapse If current substance abuse, we discussed and referral to local resources was offered.   2. Infant care and feeding:  If breastfeeding, discussed returning to work, pumping, breastfeeding-associated pain, guidance regarding return to fertility while lactating if not using another method. If needed, patient was provided with a letter to be allowed to pump q 2-3hrs to support lactation in a private location with access to a refrigerator to store breastmilk.   Recommended that all caregivers be immunized for flu, pertussis and other preventable communicable diseases If pt does not have material needs met for her/baby, referred to local resources for help obtaining these.  3. Sexuality, contraception and birth spacing Provided guidance regarding sexuality, management of dyspareunia, and resumption of intercourse Discussed avoiding interpregnancy interval <57mths and recommended birth spacing of 18 months  4. Sleep and fatigue Discussed coping options for fatigue and sleep disruption Encouraged family/partner/community support of 4 hrs of uninterrupted sleep to help with mood and fatigue  5. Physical recovery  If pt had a C/S, assessed incisional pain and providing guidance on normal vs prolonged recovery If pt had a laceration, perineal healing and pain reviewed.  If urinary or fecal incontinence, discussed management and referred to PT or uro/gyn if indicated  Patient is safe to resume physical activity. Discussed attainment of healthy  weight.  6.  Chronic disease management Discussed pregnancy complications if any, and their implications for future childbearing and long-term maternal health. Review recommendations for prevention of recurrent pregnancy complications, such as aspirin  to reduce risk of preeclampsia not applicable. Pt had GDM: No. If yes, 2hr GTT scheduled: not applicable. Reviewed medications and non-pregnant dosing including consideration of whether pt is breastfeeding using a reliable resource such as LactMed: not applicable Referred for f/u w/ PCP or subspecialist providers as indicated: not applicable  7. Health maintenance Mammogram at 20yo or earlier if indicated Pap smears as indicated  Meds: No orders of the defined types were placed in this encounter.   Follow-up: Pap due next August  No orders of the defined types were placed in this encounter.      Cathlean Ely DNP, CNM Center for [employer], [facility] 06/25/2024 3:11 PM

## 2024-08-17 ENCOUNTER — Encounter: Payer: Self-pay | Admitting: Advanced Practice Midwife

## 2024-08-17 ENCOUNTER — Telehealth: Payer: Self-pay | Admitting: Advanced Practice Midwife

## 2024-08-17 NOTE — Telephone Encounter (Signed)
 Patient is requesting a return to work note. Return date is 08/24/2024. Please advise.

## 2024-10-09 ENCOUNTER — Ambulatory Visit: Admitting: Obstetrics and Gynecology

## 2024-10-14 ENCOUNTER — Ambulatory Visit: Admitting: Advanced Practice Midwife
